# Patient Record
Sex: Female | Born: 1960 | Race: Black or African American | Hispanic: No | Marital: Married | Smoking: Former smoker
Health system: Southern US, Community
[De-identification: ages and names within clinical notes are randomized; demographics above are authoritative.]

## PROBLEM LIST (undated history)

## (undated) DIAGNOSIS — A64 Unspecified sexually transmitted disease: Secondary | ICD-10-CM

## (undated) DIAGNOSIS — R7309 Other abnormal glucose: Secondary | ICD-10-CM

## (undated) DIAGNOSIS — E7800 Pure hypercholesterolemia, unspecified: Secondary | ICD-10-CM

## (undated) DIAGNOSIS — M109 Gout, unspecified: Secondary | ICD-10-CM

## (undated) DIAGNOSIS — E669 Obesity, unspecified: Secondary | ICD-10-CM

## (undated) DIAGNOSIS — R87619 Unspecified abnormal cytological findings in specimens from cervix uteri: Secondary | ICD-10-CM

## (undated) DIAGNOSIS — N186 End stage renal disease: Secondary | ICD-10-CM

## (undated) DIAGNOSIS — I10 Essential (primary) hypertension: Secondary | ICD-10-CM

## (undated) HISTORY — DX: Unspecified abnormal cytological findings in specimens from cervix uteri: R87.619

## (undated) HISTORY — PX: COLPOSCOPY: SHX161

## (undated) HISTORY — DX: Obesity, unspecified: E66.9

## (undated) HISTORY — PX: COSMETIC SURGERY: SHX468

## (undated) HISTORY — DX: Unspecified sexually transmitted disease: A64

## (undated) HISTORY — DX: Other abnormal glucose: R73.09

## (undated) HISTORY — DX: End stage renal disease: N18.6

## (undated) HISTORY — DX: Gout, unspecified: M10.9

---

## 1989-08-09 HISTORY — PX: TUBAL LIGATION: SHX77

## 2001-03-10 ENCOUNTER — Emergency Department (HOSPITAL_COMMUNITY): Admission: EM | Admit: 2001-03-10 | Discharge: 2001-03-10 | Payer: Self-pay | Admitting: Emergency Medicine

## 2001-03-10 ENCOUNTER — Encounter: Payer: Self-pay | Admitting: Emergency Medicine

## 2003-05-23 ENCOUNTER — Other Ambulatory Visit: Admission: RE | Admit: 2003-05-23 | Discharge: 2003-05-23 | Payer: Self-pay | Admitting: Gynecology

## 2003-06-24 ENCOUNTER — Ambulatory Visit (HOSPITAL_COMMUNITY): Admission: RE | Admit: 2003-06-24 | Discharge: 2003-06-24 | Payer: Self-pay | Admitting: Internal Medicine

## 2004-12-04 ENCOUNTER — Other Ambulatory Visit: Admission: RE | Admit: 2004-12-04 | Discharge: 2004-12-04 | Payer: Self-pay | Admitting: Obstetrics and Gynecology

## 2005-07-08 ENCOUNTER — Encounter: Admission: RE | Admit: 2005-07-08 | Discharge: 2005-07-08 | Payer: Self-pay | Admitting: Nephrology

## 2005-08-09 HISTORY — PX: BREAST SURGERY: SHX581

## 2006-01-05 ENCOUNTER — Other Ambulatory Visit: Admission: RE | Admit: 2006-01-05 | Discharge: 2006-01-05 | Payer: Self-pay | Admitting: Obstetrics & Gynecology

## 2007-01-10 ENCOUNTER — Other Ambulatory Visit: Admission: RE | Admit: 2007-01-10 | Discharge: 2007-01-10 | Payer: Self-pay | Admitting: Obstetrics & Gynecology

## 2008-03-05 ENCOUNTER — Other Ambulatory Visit: Admission: RE | Admit: 2008-03-05 | Discharge: 2008-03-05 | Payer: Self-pay | Admitting: Obstetrics and Gynecology

## 2010-08-09 LAB — HM PAP SMEAR

## 2010-08-09 LAB — HM COLONOSCOPY

## 2011-08-24 ENCOUNTER — Ambulatory Visit (HOSPITAL_COMMUNITY)
Admission: RE | Admit: 2011-08-24 | Discharge: 2011-08-24 | Disposition: A | Discharge status: Home / Self Care | Payer: BC Managed Care – PPO | Source: Ambulatory Visit | Attending: Internal Medicine | Admitting: Internal Medicine

## 2011-08-24 ENCOUNTER — Other Ambulatory Visit (HOSPITAL_COMMUNITY): Payer: Self-pay | Admitting: Internal Medicine

## 2011-08-24 DIAGNOSIS — IMO0002 Reserved for concepts with insufficient information to code with codable children: Secondary | ICD-10-CM

## 2011-08-24 DIAGNOSIS — M545 Low back pain, unspecified: Secondary | ICD-10-CM | POA: Insufficient documentation

## 2011-08-24 DIAGNOSIS — M25569 Pain in unspecified knee: Secondary | ICD-10-CM

## 2012-06-20 LAB — HM MAMMOGRAPHY

## 2012-06-23 LAB — HM PAP SMEAR: HM Pap smear: NEGATIVE

## 2012-11-12 ENCOUNTER — Emergency Department (INDEPENDENT_AMBULATORY_CARE_PROVIDER_SITE_OTHER)
Admission: EM | Admit: 2012-11-12 | Discharge: 2012-11-12 | Disposition: A | Discharge status: Home / Self Care | Payer: 59 | Source: Home / Self Care | Attending: Emergency Medicine | Admitting: Emergency Medicine

## 2012-11-12 ENCOUNTER — Encounter (HOSPITAL_COMMUNITY): Payer: Self-pay | Admitting: Emergency Medicine

## 2012-11-12 DIAGNOSIS — M722 Plantar fascial fibromatosis: Secondary | ICD-10-CM

## 2012-11-12 HISTORY — DX: Essential (primary) hypertension: I10

## 2012-11-12 HISTORY — DX: Pure hypercholesterolemia, unspecified: E78.00

## 2012-11-12 MED ORDER — TRAMADOL HCL 50 MG PO TABS: 50.0000 mg | ORAL_TABLET | Freq: Four times a day (QID) | ORAL | Status: DC | PRN

## 2012-11-12 NOTE — ED Provider Notes (Signed)
History     CSN: [id number]  Arrival date & time 11/12/12  1329   First MD Initiated Contact with Patient 11/12/12 1359      Chief Complaint  Patient presents with  . Foot Pain    left lateral foot pain. denies injur. no swelling or redness    (Consider location/radiation/quality/duration/timing/severity/associated sxs/prior treatment) HPI Comments: 52 year old female presents with 2 days of foot pain. 2 days ago she awoke placed her left foot on the floor and experienced acute severe pain along the lateral age and a portion of the plantar heel. She denies any known trauma or injury to the foot. The pain is worse with walking and for splinting foot all floor after several minutes or hours of rest. She wears narrow shoes with elevated he heels.   Past Medical History  Diagnosis Date  . Hypertension   . High cholesterol     History reviewed. No pertinent past surgical history.  History reviewed. No pertinent family history.  History  Substance Use Topics  . Smoking status: Never Smoker   . Smokeless tobacco: Not on file  . Alcohol Use: No    OB History   Grav Para Term Preterm Abortions TAB SAB Ect Mult Living                  Review of Systems  Constitutional: Negative for fever, chills and activity change.  HENT: Negative.   Respiratory: Negative.   Musculoskeletal: Negative for myalgias, back pain and joint swelling.       As per HPI  Skin: Negative for color change, pallor and rash.  Neurological: Negative.   Psychiatric/Behavioral: Negative.     Allergies  Review of patient's allergies indicates no known allergies.  Home Medications   Current Outpatient Rx  Name  Route  Sig  Dispense  Refill  . AMLODIPINE BESYLATE PO   Oral   Take by mouth.         Marland Kitchen LOSARTAN POTASSIUM PO   Oral   Take by mouth.         . traMADol (ULTRAM) 50 MG tablet   Oral   Take 1 tablet (50 mg total) by mouth every 6 (six) hours as needed for pain.   20 tablet   0      BP 115/78  Pulse 77  Temp(Src) 98.2 F (36.8 C) (Oral)  Resp 20  SpO2 100%  Physical Exam  Nursing note and vitals reviewed. Constitutional: She is oriented to person, place, and time. She appears well-developed and well-nourished. No distress.  HENT:  Head: Normocephalic and atraumatic.  Eyes: EOM are normal.  Neck: Normal range of motion. Neck supple.  Pulmonary/Chest: Effort normal. No respiratory distress.  Musculoskeletal: Normal range of motion. She exhibits tenderness. She exhibits no edema.  Left foot and ankle architecture is normal with exception of old, chronic 90 and distal first metatarsal. This is painless and nonproblematic. Tenderness is greatest along the lateral and plantar aspect of the heel and proximal foot. No tenderness of the metatarsals or ankle. No erythema, deformity or swelling.  Neurological: She is alert and oriented to person, place, and time. No cranial nerve deficit.  Skin: Skin is warm and dry.  Psychiatric: She has a normal mood and affect.    ED Course  Procedures (including critical care time)  Labs Reviewed - No data to display No results found.   1. Plantar fasciitis of left foot       MDM  Wear  comfortable shoes as described in your written instructions Full shoe insert with good arch support Roll the foot over frozen can several times during the day Tramadol 50 mg every 4-6 hours when necessary pain Followup with [facility] or your primary care doctor as needed for persistent pain.        Janne Napoleon, NP 11/12/12 1623

## 2012-11-12 NOTE — ED Provider Notes (Signed)
Medical screening examination/treatment/procedure(s) were performed by non-physician practitioner and as supervising physician I was immediately available for consultation/collaboration.  Philipp Deputy, M.D.  Harden Mo, MD 11/12/12 [phone]

## 2012-11-12 NOTE — ED Notes (Signed)
Reports: left lateral foot pain. Denies injury. No swelling or redness.  Pt states pain with standing and walking. Pt has tried tylenol and aleve with no relief of pain   Symptoms x about 1 wk.   Family hx of gout.

## 2013-06-20 ENCOUNTER — Encounter: Payer: Self-pay | Admitting: Internal Medicine

## 2013-06-20 DIAGNOSIS — R7309 Other abnormal glucose: Secondary | ICD-10-CM | POA: Insufficient documentation

## 2013-06-20 DIAGNOSIS — I10 Essential (primary) hypertension: Secondary | ICD-10-CM | POA: Insufficient documentation

## 2013-06-20 DIAGNOSIS — E669 Obesity, unspecified: Secondary | ICD-10-CM

## 2013-06-20 DIAGNOSIS — E7800 Pure hypercholesterolemia, unspecified: Secondary | ICD-10-CM | POA: Insufficient documentation

## 2013-06-21 ENCOUNTER — Encounter: Payer: Self-pay | Admitting: Emergency Medicine

## 2013-06-21 ENCOUNTER — Ambulatory Visit: Payer: 59 | Admitting: Emergency Medicine

## 2013-06-21 ENCOUNTER — Other Ambulatory Visit: Payer: Self-pay | Admitting: Emergency Medicine

## 2013-06-21 VITALS — BP 118/70 | HR 80 | Temp 98.0°F | Resp 18 | Ht 63.5 in | Wt 203.0 lb

## 2013-06-21 DIAGNOSIS — E559 Vitamin D deficiency, unspecified: Secondary | ICD-10-CM

## 2013-06-21 DIAGNOSIS — M109 Gout, unspecified: Secondary | ICD-10-CM

## 2013-06-21 DIAGNOSIS — R7309 Other abnormal glucose: Secondary | ICD-10-CM

## 2013-06-21 DIAGNOSIS — E782 Mixed hyperlipidemia: Secondary | ICD-10-CM

## 2013-06-21 DIAGNOSIS — I10 Essential (primary) hypertension: Secondary | ICD-10-CM

## 2013-06-21 DIAGNOSIS — Z0000 Encounter for general adult medical examination without abnormal findings: Secondary | ICD-10-CM

## 2013-06-21 LAB — CBC WITH DIFFERENTIAL/PLATELET
Basophils Absolute: 0 10*3/uL (ref 0.0–0.1)
Basophils Relative: 0 % (ref 0–1)
Eosinophils Absolute: 0.2 10*3/uL (ref 0.0–0.7)
Eosinophils Relative: 2 % (ref 0–5)
HCT: 45 % (ref 36.0–46.0)
Hemoglobin: 15.3 g/dL — ABNORMAL HIGH (ref 12.0–15.0)
Lymphocytes Relative: 43 % (ref 12–46)
Lymphs Abs: 4.1 10*3/uL — ABNORMAL HIGH (ref 0.7–4.0)
MCH: 31.9 pg (ref 26.0–34.0)
MCHC: 34 g/dL (ref 30.0–36.0)
MCV: 93.8 fL (ref 78.0–100.0)
Monocytes Absolute: 0.5 10*3/uL (ref 0.1–1.0)
Monocytes Relative: 5 % (ref 3–12)
Neutro Abs: 4.8 10*3/uL (ref 1.7–7.7)
Neutrophils Relative %: 50 % (ref 43–77)
Platelets: 304 10*3/uL (ref 150–400)
RBC: 4.8 MIL/uL (ref 3.87–5.11)
RDW: 14.3 % (ref 11.5–15.5)
WBC: 9.5 10*3/uL (ref 4.0–10.5)

## 2013-06-21 LAB — BASIC METABOLIC PANEL WITH GFR
BUN: 23 mg/dL (ref 6–23)
CO2: 29 mEq/L (ref 19–32)
Calcium: 9.7 mg/dL (ref 8.4–10.5)
Chloride: 101 mEq/L (ref 96–112)
Creat: 1.71 mg/dL — ABNORMAL HIGH (ref 0.50–1.10)
GFR, Est African American: 39 mL/min — ABNORMAL LOW
GFR, Est Non African American: 34 mL/min — ABNORMAL LOW
Glucose, Bld: 89 mg/dL (ref 70–99)
Potassium: 3.6 mEq/L (ref 3.5–5.3)
Sodium: 139 mEq/L (ref 135–145)

## 2013-06-21 LAB — HEPATIC FUNCTION PANEL
ALT: 27 U/L (ref 0–35)
AST: 15 U/L (ref 0–37)
Albumin: 4.2 g/dL (ref 3.5–5.2)
Alkaline Phosphatase: 60 U/L (ref 39–117)
Bilirubin, Direct: 0.1 mg/dL (ref 0.0–0.3)
Indirect Bilirubin: 0.6 mg/dL (ref 0.0–0.9)
Total Bilirubin: 0.7 mg/dL (ref 0.3–1.2)
Total Protein: 7.4 g/dL (ref 6.0–8.3)

## 2013-06-21 LAB — HM MAMMOGRAPHY

## 2013-06-21 LAB — HM DEXA SCAN

## 2013-06-21 LAB — TSH: TSH: 1.221 u[IU]/mL (ref 0.350–4.500)

## 2013-06-21 LAB — MAGNESIUM: Magnesium: 2 mg/dL (ref 1.5–2.5)

## 2013-06-21 LAB — URIC ACID: Uric Acid, Serum: 8.6 mg/dL — ABNORMAL HIGH (ref 2.4–7.0)

## 2013-06-21 MED ORDER — LOSARTAN POTASSIUM 100 MG PO TABS: 100.0000 mg | ORAL_TABLET | Freq: Every day | ORAL | Status: DC

## 2013-06-21 NOTE — Patient Instructions (Signed)
Gout  Gout is when your joints become red, sore, and swell (inflammed). This is caused by the buildup of uric acid crystals in the joints. Uric acid is a chemical that is normally in the blood. If the level of uric acid gets too high in the blood, these crystals form in your joints and tissues. Over time, these crystals can form into masses near the joints and tissues. These masses can destroy bone and cause the bone to look misshapen (deformed).  HOME CARE   · Do not take aspirin for pain.  · Only take medicine as told by your doctor.  · Rest the joint as much as you can. When in bed, keep sheets and blankets off painful areas.  · Keep the sore joints raised (elevated).  · Put warm or cold packs on painful joints. Use of warm or cold packs depends on which works best for you.  · Use crutches if the painful joint is in your leg.  · Drink enough fluids to keep your pee (urine) clear or pale yellow. Limit alcohol, sugary drinks, and drinks with fructose in them.  · Follow your diet instructions. Pay careful attention to how much protein you eat. Include fruits, vegetables, whole grains, and fat-free or low-fat milk products in your daily diet. Talk to your doctor or dietician about the use of coffee, vitamin C, and cherries. These may help lower uric acid levels.  · Keep a healthy body weight.  GET HELP RIGHT AWAY IF:   · You have watery poop (diarrhea), throw up (vomit), or have any side effects from medicines.  · You do not feel better in 24 hours, or you are getting worse.  · Your joint becomes suddenly more tender, and you have chills or a fever.  MAKE SURE YOU:   · Understand these instructions.  · Will watch your condition.  · Will get help right away if you are not doing well or get worse.  Document Released: 05/04/2008 Document Revised: 11/20/2012 Document Reviewed: 11/03/2009  ExitCare® Patient Information ©2014 [employer].

## 2013-06-22 ENCOUNTER — Telehealth: Payer: Self-pay | Admitting: *Deleted

## 2013-06-22 DIAGNOSIS — M109 Gout, unspecified: Secondary | ICD-10-CM

## 2013-06-22 LAB — URINALYSIS, MICROSCOPIC ONLY
Casts: NONE SEEN
Crystals: NONE SEEN

## 2013-06-22 LAB — MICROALBUMIN / CREATININE URINE RATIO
Creatinine, Urine: 194.6 mg/dL
Microalb Creat Ratio: 226.3 mg/g — ABNORMAL HIGH (ref 0.0–30.0)
Microalb, Ur: 44.03 mg/dL — ABNORMAL HIGH (ref 0.00–1.89)

## 2013-06-22 LAB — URINALYSIS, ROUTINE W REFLEX MICROSCOPIC
Bilirubin Urine: NEGATIVE
Glucose, UA: NEGATIVE mg/dL
Hgb urine dipstick: NEGATIVE
Ketones, ur: NEGATIVE mg/dL
Leukocytes, UA: NEGATIVE
Nitrite: NEGATIVE
Protein, ur: 100 mg/dL — AB
Specific Gravity, Urine: 1.018 (ref 1.005–1.030)
Urobilinogen, UA: 0.2 mg/dL (ref 0.0–1.0)
pH: 6 (ref 5.0–8.0)

## 2013-06-22 MED ORDER — ALLOPURINOL 300 MG PO TABS: 300.0000 mg | ORAL_TABLET | Freq: Every day | ORAL | Status: DC

## 2013-06-22 MED ORDER — PREDNISONE (PAK) 10 MG PO TABS
ORAL_TABLET | Freq: Every day | ORAL | Status: DC
Start: 2013-06-22 — End: 2013-08-10

## 2013-06-22 NOTE — Telephone Encounter (Signed)
Advise her gout level still elevated will call in Allopurinol and repeat Pred DP.  Labs are still not complete will call Monday with pending results.

## 2013-06-22 NOTE — Telephone Encounter (Signed)
PTS GREAT TOE IS PAINFUL DOES NOT WANT TO END UP IN THE (ER) THIS WEEKEND  *PLEASE ADVISE  PT SAID SHE DISCUSSED THIS YESTERDAY?

## 2013-06-22 NOTE — Telephone Encounter (Signed)
Pt aware.

## 2013-06-24 ENCOUNTER — Encounter: Payer: Self-pay | Admitting: Emergency Medicine

## 2013-06-24 MED ORDER — CIPROFLOXACIN HCL 500 MG PO TABS: 500.0000 mg | ORAL_TABLET | Freq: Two times a day (BID) | ORAL | Status: AC

## 2013-06-24 NOTE — Progress Notes (Signed)
Subjective:    Patient ID: Ashley Boyle, female    DOB: 06-24-1961, 52 y.o.   MRN: [id number]  HPI Comments: 52 yo AAF presents for CPE. She has been doing well overall, only new concern is recently diagnosed with Gout by UC. She has also seen Sophronia Simas for f/u with continued RT great toe pain and swelling. She completed a trial of Prednisone with some relief, Uloric made symptoms worse, and did short trial of Colcrys with some relief. Mother has Hx of gout. She has been eating descent and exercising had increased until gout started x 2 weeks now with symptoms.    Current Outpatient Prescriptions on File Prior to Visit  Medication Sig Dispense Refill  . AMLODIPINE BESYLATE PO Take 5 mg by mouth daily.       . Pitavastatin Calcium (LIVALO) 4 MG TABS Take 4 mg by mouth daily. 1 qod      . traMADol (ULTRAM) 50 MG tablet Take 1 tablet (50 mg total) by mouth every 6 (six) hours as needed for pain.  20 tablet  0   No current facility-administered medications on file prior to visit.   ALLERGIES Crestor; Lipitor; and Vytorin INTOL-Uloric- Increased edema  Past Medical History  Diagnosis Date  . Hypertension   . High cholesterol   . Obesity, unspecified   . Other abnormal glucose     Past Surgical History  Procedure Laterality Date  . Tubal ligation    . Colposcopy    . Cosmetic surgery      breast reduction    History  Substance Use Topics  . Smoking status: Former Smoker    Quit date: 06/21/1988  . Smokeless tobacco: Not on file  . Alcohol Use: No    Family History  Problem Relation Age of Onset  . Hypertension Mother   . Cancer Mother     cervical  . Hyperlipidemia Mother   . Hypertension Father   . Hyperlipidemia Father   . Diabetes Other   . Cancer Other 40    breast      Review of Systems  Eyes:       Dr. Nicki Reaper  Gastrointestinal:       Dr. Collene Mares  Genitourinary:       Dr. Hassell Done- Nephrology. Dr. Mayer Masker  Musculoskeletal: Positive for arthralgias.       Murphy/  Noemi Chapel  All other systems reviewed and are negative.   BP 118/70  Pulse 80  Temp(Src) 98 F (36.7 C) (Oral)  Resp 18  Ht 5' 3.5" (1.613 m)  Wt 203 lb (92.08 kg)  BMI 35.39 kg/m2     Objective:   Physical Exam  Nursing note and vitals reviewed. Constitutional: She is oriented to person, place, and time. She appears well-developed and well-nourished.  HENT:  Head: Normocephalic and atraumatic.  Right Ear: External ear normal.  Left Ear: External ear normal.  Nose: Nose normal.  Mouth/Throat: Oropharynx is clear and moist.  Eyes: Conjunctivae and EOM are normal. Pupils are equal, round, and reactive to light.  Neck: Normal range of motion. Neck supple. No JVD present. No tracheal deviation present. No thyromegaly present.  Cardiovascular: Normal rate, regular rhythm, normal heart sounds and intact distal pulses.   Pulmonary/Chest: Effort normal and breath sounds normal. She exhibits no tenderness.  Abdominal: Soft. Bowel sounds are normal. She exhibits no distension and no mass. There is no tenderness. There is no guarding.  Genitourinary:  Defer to GYN/ Nephr  Musculoskeletal: Normal range of motion.  She exhibits edema and tenderness.  Right great toe  Lymphadenopathy:    She has no cervical adenopathy.  Neurological: She is alert and oriented to person, place, and time. She has normal reflexes. No cranial nerve deficit. Coordination normal.  Skin: Skin is warm and dry.  Psychiatric: She has a normal mood and affect. Her behavior is normal. Judgment and thought content normal.     EKG NSCSPT WNL     Assessment & Plan:  1. CPE check labs, will get mammo and BMD today. Advised needs wt loss, better diet and cardio QD. 2. New DX of gout with RT great toe pain with difficulty with Uloric, check labs Prednisone 10 mg DP AD, Allopurinol 300 mg AD. D/C HCTZ new Rx Losartan 100 mg AD given

## 2013-06-25 ENCOUNTER — Other Ambulatory Visit: Payer: Self-pay | Admitting: Emergency Medicine

## 2013-06-25 DIAGNOSIS — N390 Urinary tract infection, site not specified: Secondary | ICD-10-CM

## 2013-06-25 LAB — URINE CULTURE: Culture: >=100000

## 2013-06-25 LAB — TB SKIN TEST
Induration: 0 mm
TB Skin Test: NEGATIVE

## 2013-06-29 ENCOUNTER — Ambulatory Visit: Payer: Self-pay | Admitting: Nurse Practitioner

## 2013-07-09 ENCOUNTER — Ambulatory Visit (INDEPENDENT_AMBULATORY_CARE_PROVIDER_SITE_OTHER): Payer: 59 | Admitting: Emergency Medicine

## 2013-07-09 VITALS — BP 122/72 | HR 76 | Resp 18

## 2013-07-09 DIAGNOSIS — I10 Essential (primary) hypertension: Secondary | ICD-10-CM

## 2013-07-09 DIAGNOSIS — M109 Gout, unspecified: Secondary | ICD-10-CM

## 2013-07-09 LAB — CBC WITH DIFFERENTIAL/PLATELET
Basophils Absolute: 0 10*3/uL (ref 0.0–0.1)
Basophils Relative: 1 % (ref 0–1)
Eosinophils Absolute: 0.1 10*3/uL (ref 0.0–0.7)
Eosinophils Relative: 2 % (ref 0–5)
HCT: 41.3 % (ref 36.0–46.0)
Hemoglobin: 14 g/dL (ref 12.0–15.0)
Lymphocytes Relative: 47 % — ABNORMAL HIGH (ref 12–46)
Lymphs Abs: 2.9 10*3/uL (ref 0.7–4.0)
MCH: 32.3 pg (ref 26.0–34.0)
MCHC: 33.9 g/dL (ref 30.0–36.0)
MCV: 95.2 fL (ref 78.0–100.0)
Monocytes Absolute: 0.5 10*3/uL (ref 0.1–1.0)
Monocytes Relative: 9 % (ref 3–12)
Neutro Abs: 2.6 10*3/uL (ref 1.7–7.7)
Neutrophils Relative %: 41 % — ABNORMAL LOW (ref 43–77)
Platelets: 312 10*3/uL (ref 150–400)
RBC: 4.34 MIL/uL (ref 3.87–5.11)
RDW: 14.3 % (ref 11.5–15.5)
WBC: 6.2 10*3/uL (ref 4.0–10.5)

## 2013-07-09 MED ORDER — FUROSEMIDE 20 MG PO TABS: 20.0000 mg | ORAL_TABLET | Freq: Every day | ORAL | Status: DC

## 2013-07-09 NOTE — Patient Instructions (Signed)
Edema °Edema is a buildup of fluids. It is most common in the feet, ankles, and legs. This happens more as a person ages. It may affect one or both legs. °HOME CARE  °· Raise (elevate) the legs or ankles above the level of the heart while lying down. °· Avoid sitting or standing still for a long time. °· Exercise the legs to help the puffiness (swelling) go down. °· A low-salt diet may help lessen the puffiness. °· Only take medicine as told by your doctor. °GET HELP RIGHT AWAY IF:  °· You develop shortness of breath or chest pain. °· You cannot breathe when you lie down. °· You have more puffiness that does not go away with treatment. °· You develop pain or redness in the areas that are puffy. °· You have a temperature by mouth above 102° F (38.9° C), not controlled by medicine. °· You gain 03 lb/1.4 kg or more in 1 day or 05 lb/2.3 kg in a week. °MAKE SURE YOU:  °· Understand these instructions. °· Will watch your condition. °· Will get help right away if you are not doing well or get worse. °Document Released: 01/12/2008 Document Revised: 10/18/2011 Document Reviewed: 01/12/2008 °ExitCare® Patient Information ©2014 [employer]. ° °

## 2013-07-10 ENCOUNTER — Encounter: Payer: Self-pay | Admitting: Emergency Medicine

## 2013-07-10 LAB — URIC ACID: Uric Acid, Serum: 6.3 mg/dL (ref 2.4–7.0)

## 2013-07-10 LAB — BASIC METABOLIC PANEL WITH GFR
BUN: 19 mg/dL (ref 6–23)
CO2: 26 mEq/L (ref 19–32)
Calcium: 10 mg/dL (ref 8.4–10.5)
Chloride: 105 mEq/L (ref 96–112)
Creat: 1.72 mg/dL — ABNORMAL HIGH (ref 0.50–1.10)
GFR, Est African American: 39 mL/min — ABNORMAL LOW
GFR, Est Non African American: 33 mL/min — ABNORMAL LOW
Glucose, Bld: 95 mg/dL (ref 70–99)
Potassium: 4.2 mEq/L (ref 3.5–5.3)
Sodium: 140 mEq/L (ref 135–145)

## 2013-07-10 NOTE — Progress Notes (Signed)
   Subjective:    Patient ID: Ashley Boyle, female    DOB: 04/30/1961, 52 y.o.   MRN: [id number]  HPI Comments: 52 yo recent diagnosis of gout was started on Allopurinol, Prednisone and supposed to d/c Losartan/Hctz combo. Patient restarted hctz last week due to some edema in hands. She has had improvement with her right foot pain and edema. She has not been eating well over the holidays and has not been checking hr BP.    Current Outpatient Prescriptions on File Prior to Visit  Medication Sig Dispense Refill  . allopurinol (ZYLOPRIM) 300 MG tablet Take 1 tablet (300 mg total) by mouth daily.  30 tablet  6  . AMLODIPINE BESYLATE PO Take 5 mg by mouth daily.       Marland Kitchen estradiol (ESTRACE) 0.5 MG tablet Take 0.5 mg by mouth daily.      Marland Kitchen losartan (COZAAR) 100 MG tablet Take 1 tablet (100 mg total) by mouth daily.  90 tablet  2  . progesterone (PROMETRIUM) 100 MG capsule Take 100 mg by mouth daily.      . Pitavastatin Calcium (LIVALO) 4 MG TABS Take 4 mg by mouth daily. 1 qod      . predniSONE (STERAPRED UNI-PAK) 10 MG tablet Take by mouth daily.  21 tablet  0  . traMADol (ULTRAM) 50 MG tablet Take 1 tablet (50 mg total) by mouth every 6 (six) hours as needed for pain.  20 tablet  0   No current facility-administered medications on file prior to visit.   ALLERGIES Crestor; Lipitor; and Vytorin  Past Medical History  Diagnosis Date  . Hypertension   . High cholesterol   . Obesity, unspecified   . Other abnormal glucose    NEW DIAGNOSIS GOUT  Review of Systems  All other systems reviewed and are negative.    BP 122/72  Pulse 76  Resp 18 Refused weight    Objective:   Physical Exam  Nursing note and vitals reviewed. Constitutional: She is oriented to person, place, and time. She appears well-developed and well-nourished.  HENT:  Head: Normocephalic.  Neck: Normal range of motion.  Cardiovascular: Normal rate, regular rhythm, normal heart sounds and intact distal pulses.    Pulmonary/Chest: Effort normal and breath sounds normal.  Abdominal: Soft. There is no tenderness.  Musculoskeletal: Normal range of motion. She exhibits tenderness. She exhibits no edema.  Minimal right great toe base.  Neurological: She is alert and oriented to person, place, and time.  Skin: Skin is warm and dry. No erythema.          Assessment & Plan:  New diagnosis Gout with new RX Allopurinol and recent RX Prednisone. Advised AGAIN d/c HCTZ will give RX lasix 20 mg AD/ prn edema, switch to Losartan only, do not take COMBO pill with HCTZ. Recheck labs

## 2013-07-14 ENCOUNTER — Other Ambulatory Visit: Payer: Self-pay | Admitting: Emergency Medicine

## 2013-07-23 ENCOUNTER — Encounter: Payer: Self-pay | Admitting: Nurse Practitioner

## 2013-07-23 ENCOUNTER — Ambulatory Visit: Payer: Self-pay | Admitting: Nurse Practitioner

## 2013-08-07 ENCOUNTER — Encounter: Payer: Self-pay | Admitting: Nurse Practitioner

## 2013-08-08 ENCOUNTER — Other Ambulatory Visit: Payer: Self-pay | Admitting: Nurse Practitioner

## 2013-08-08 ENCOUNTER — Ambulatory Visit: Payer: Self-pay | Admitting: Nurse Practitioner

## 2013-08-10 ENCOUNTER — Ambulatory Visit: Payer: 59 | Admitting: Nurse Practitioner

## 2013-08-10 ENCOUNTER — Encounter: Payer: Self-pay | Admitting: Nurse Practitioner

## 2013-08-10 ENCOUNTER — Ambulatory Visit (INDEPENDENT_AMBULATORY_CARE_PROVIDER_SITE_OTHER): Payer: 59 | Admitting: Nurse Practitioner

## 2013-08-10 VITALS — BP 142/82 | HR 68 | Resp 16 | Ht 63.0 in | Wt 211.0 lb

## 2013-08-10 DIAGNOSIS — M109 Gout, unspecified: Secondary | ICD-10-CM

## 2013-08-10 DIAGNOSIS — I10 Essential (primary) hypertension: Secondary | ICD-10-CM

## 2013-08-10 DIAGNOSIS — Z01419 Encounter for gynecological examination (general) (routine) without abnormal findings: Secondary | ICD-10-CM

## 2013-08-10 DIAGNOSIS — Z0000 Encounter for general adult medical examination without abnormal findings: Secondary | ICD-10-CM

## 2013-08-10 DIAGNOSIS — R7309 Other abnormal glucose: Secondary | ICD-10-CM

## 2013-08-10 DIAGNOSIS — E559 Vitamin D deficiency, unspecified: Secondary | ICD-10-CM

## 2013-08-10 DIAGNOSIS — E782 Mixed hyperlipidemia: Secondary | ICD-10-CM

## 2013-08-10 LAB — POCT URINALYSIS DIPSTICK
Bilirubin, UA: NEGATIVE
Blood, UA: NEGATIVE
Glucose, UA: NEGATIVE
Ketones, UA: NEGATIVE
Leukocytes, UA: NEGATIVE
Nitrite, UA: NEGATIVE
Urobilinogen, UA: NEGATIVE
pH, UA: 5

## 2013-08-10 MED ORDER — ESTRADIOL 0.5 MG PO TABS: 0.5000 mg | ORAL_TABLET | Freq: Every day | ORAL | Status: DC

## 2013-08-10 MED ORDER — PROGESTERONE MICRONIZED 100 MG PO CAPS: 100.0000 mg | ORAL_CAPSULE | Freq: Every day | ORAL | Status: DC

## 2013-08-10 NOTE — Progress Notes (Signed)
53 y.o. [id number] Married African American Fe here for annual exam. She feels well on HRT and wants to continue.    Patient's last menstrual period was 08/09/2006.          Sexually active: yes  The current method of family planning is tubal ligation.    Exercising: no  not regularly Smoker:  no  Health Maintenance: Pap:  06/23/12 WNL History of abnormal Pap: 2010 - normal since MMG:  06/21/13 normal Colonoscopy:  2012 repeat in 10 years BMD:   06/21/13-patient is unsure of results TDaP:  05/10/11 Screening Labs: PCP, Urine today: PROTEIN-2+   reports that she quit smoking about 25 years ago. She has never used smokeless tobacco. She reports that she does not drink alcohol or use illicit drugs.  Past Medical History  Diagnosis Date  . Hypertension   . High cholesterol   . Obesity, unspecified   . Other abnormal glucose   . 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 Gout     Past Surgical History  Procedure Laterality Date  . Cosmetic surgery      breast reduction  . Breast surgery Bilateral 2007    reduction  . Colposcopy  108/10    negative, repeat pap normal  . Tubal ligation  1991    Current Outpatient Prescriptions  Medication Sig Dispense Refill  . allopurinol (ZYLOPRIM) 300 MG tablet Take 1 tablet (300 mg total) by mouth daily.  30 tablet  6  . AMLODIPINE BESYLATE PO Take 5 mg by mouth daily.       Marland Kitchen estradiol (ESTRACE) 0.5 MG tablet Take 1 tablet (0.5 mg total) by mouth daily.  90 tablet  3  . furosemide (LASIX) 20 MG tablet Take 1 tablet (20 mg total) by mouth daily.  30 tablet  11  . LIVALO 4 MG TABS TAKE 1 TABLET BY MOUTH DAILY  30 tablet  2  . losartan (COZAAR) 100 MG tablet Take 100 mg by mouth daily.      . progesterone (PROMETRIUM) 100 MG capsule Take 1 capsule (100 mg total) by mouth daily.  90 capsule  3   No current facility-administered medications for this visit.    Family History  Problem Relation Age of Onset  . Hypertension  Mother   . Cancer Mother     cervical  . Hyperlipidemia Mother   . Hypertension Father   . Hyperlipidemia Father   . Diabetes Other   . Cancer Other 40    breast  . Hyperlipidemia Brother   . Hypertension Brother     ROS:  Pertinent items are noted in HPI.  Otherwise, a comprehensive ROS was negative.  Exam:   BP 142/82  Pulse 68  Resp 16  Ht 5\' 3"  (1.6 m)  Wt 211 lb (95.709 kg)  BMI 37.39 kg/m2  LMP 08/09/2006  Weight change: @WEIGHT  CHANGE@ Height:   Height: 5\' 3"  (160 cm)  Ht Readings from Last 3 Encounters:  08/10/13 5\' 3"  (1.6 m)  06/21/13 5' 3.5" (1.613 m)    General appearance: alert, cooperative and appears stated age Head: Normocephalic, without obvious abnormality, atraumatic Neck: no adenopathy, supple, symmetrical, trachea midline and thyroid normal to inspection and palpation Lungs: clear to auscultation bilaterally Breasts: normal appearance, no masses or tenderness Heart: regular rate and rhythm Abdomen: soft, non-tender; bowel sounds normal; no masses,  no organomegaly Extremities: extremities normal, atraumatic, no cyanosis or edema Skin: Skin color,  texture, turgor normal. No rashes or lesions Lymph nodes: Cervical, supraclavicular, and axillary nodes normal. No abnormal inguinal nodes palpated Neurologic: Grossly normal   Pelvic: External genitalia:  no lesions              Urethra:  normal appearing urethra with no masses, tenderness or lesions              Bartholin's and Skene's: normal                 Vagina: normal appearing vagina with normal color and discharge, no lesions              Cervix: anteverted              Pap taken: yes Bimanual Exam:  Uterus:  normal size, contour, position, consistency, mobility, non-tender              Adnexa: no mass, fullness, tenderness               Rectovaginal: Confirms               Anus:  normal sphincter tone, no lesions  A:  Well Woman with normal exam  History of early menopause and HRT 09/2007  to present  History of abnormal pap 2010 with HPV, colpo biopsy normal  History of HTN  P:   Mammogram due 11/15  Pap smear done today  Refill on HRT for a year  Counseled about potential risk of CVA,DVT,cancer, etc.  Counseled on breast self exam, mammography screening, use and side effects of HRT, adequate intake of calcium and vitamin D, diet and exercise return annually or prn  An After Visit Summary was printed and given to the patient.

## 2013-08-10 NOTE — Patient Instructions (Signed)

## 2013-08-13 NOTE — Progress Notes (Signed)
Encounter reviewed by Dr. Brook Silva.  

## 2013-08-15 LAB — IPS PAP TEST WITH HPV

## 2013-08-15 NOTE — Progress Notes (Signed)
Scheduled

## 2013-08-15 NOTE — Progress Notes (Signed)
08/23/13 scheduled

## 2013-08-20 ENCOUNTER — Ambulatory Visit: Payer: Self-pay | Admitting: Emergency Medicine

## 2013-08-22 ENCOUNTER — Telehealth: Payer: Self-pay | Admitting: Nurse Practitioner

## 2013-08-22 NOTE — Telephone Encounter (Signed)
Patient wants to "change prescription to a combo pill." Patient went to pick up her prescription and the price has gone up.

## 2013-08-22 NOTE — Telephone Encounter (Signed)
Ashley Boyle is there a combination pill that you think may work for patient? She is already of Estrace and Prometrium. Advised patient to obtain rx formulary and see what her insurance covers. Advised insurance may not cover a combination pill. Advised I would send a message to [facility] but that patient should look for pharmacy formulary and call us back as well.

## 2013-08-23 ENCOUNTER — Ambulatory Visit (INDEPENDENT_AMBULATORY_CARE_PROVIDER_SITE_OTHER): Payer: 59 | Admitting: Emergency Medicine

## 2013-08-23 ENCOUNTER — Encounter: Payer: Self-pay | Admitting: Emergency Medicine

## 2013-08-23 VITALS — BP 122/78 | HR 64 | Temp 98.2°F | Resp 18 | Ht 63.5 in | Wt 206.0 lb

## 2013-08-23 DIAGNOSIS — E559 Vitamin D deficiency, unspecified: Secondary | ICD-10-CM

## 2013-08-23 DIAGNOSIS — R7309 Other abnormal glucose: Secondary | ICD-10-CM

## 2013-08-23 DIAGNOSIS — M109 Gout, unspecified: Secondary | ICD-10-CM

## 2013-08-23 DIAGNOSIS — I10 Essential (primary) hypertension: Secondary | ICD-10-CM

## 2013-08-23 DIAGNOSIS — E782 Mixed hyperlipidemia: Secondary | ICD-10-CM

## 2013-08-23 LAB — CBC WITH DIFFERENTIAL/PLATELET
Basophils Absolute: 0 10*3/uL (ref 0.0–0.1)
Basophils Relative: 1 % (ref 0–1)
Eosinophils Absolute: 0.1 10*3/uL (ref 0.0–0.7)
Eosinophils Relative: 2 % (ref 0–5)
HCT: 40.9 % (ref 36.0–46.0)
Hemoglobin: 13.6 g/dL (ref 12.0–15.0)
Lymphocytes Relative: 51 % — ABNORMAL HIGH (ref 12–46)
Lymphs Abs: 3.1 10*3/uL (ref 0.7–4.0)
MCH: 31.9 pg (ref 26.0–34.0)
MCHC: 33.3 g/dL (ref 30.0–36.0)
MCV: 95.8 fL (ref 78.0–100.0)
Monocytes Absolute: 0.4 10*3/uL (ref 0.1–1.0)
Monocytes Relative: 6 % (ref 3–12)
Neutro Abs: 2.4 10*3/uL (ref 1.7–7.7)
Neutrophils Relative %: 40 % — ABNORMAL LOW (ref 43–77)
Platelets: 281 10*3/uL (ref 150–400)
RBC: 4.27 MIL/uL (ref 3.87–5.11)
RDW: 13.6 % (ref 11.5–15.5)
WBC: 6 10*3/uL (ref 4.0–10.5)

## 2013-08-23 LAB — HEMOGLOBIN A1C
Hgb A1c MFr Bld: 5 % (ref <5.7)
Mean Plasma Glucose: 97 mg/dL (ref <117)

## 2013-08-23 IMAGING — CR DG LUMBAR SPINE COMPLETE 4+V
5 series · 5 of 5 positions shown · non-contrast
Comparison: None.

CLINICAL DATA: Low back pain radiating down the right leg for 1
month

LUMBAR SPINE - COMPLETE 4+ VIEW

[view not recorded (1 of 5)]
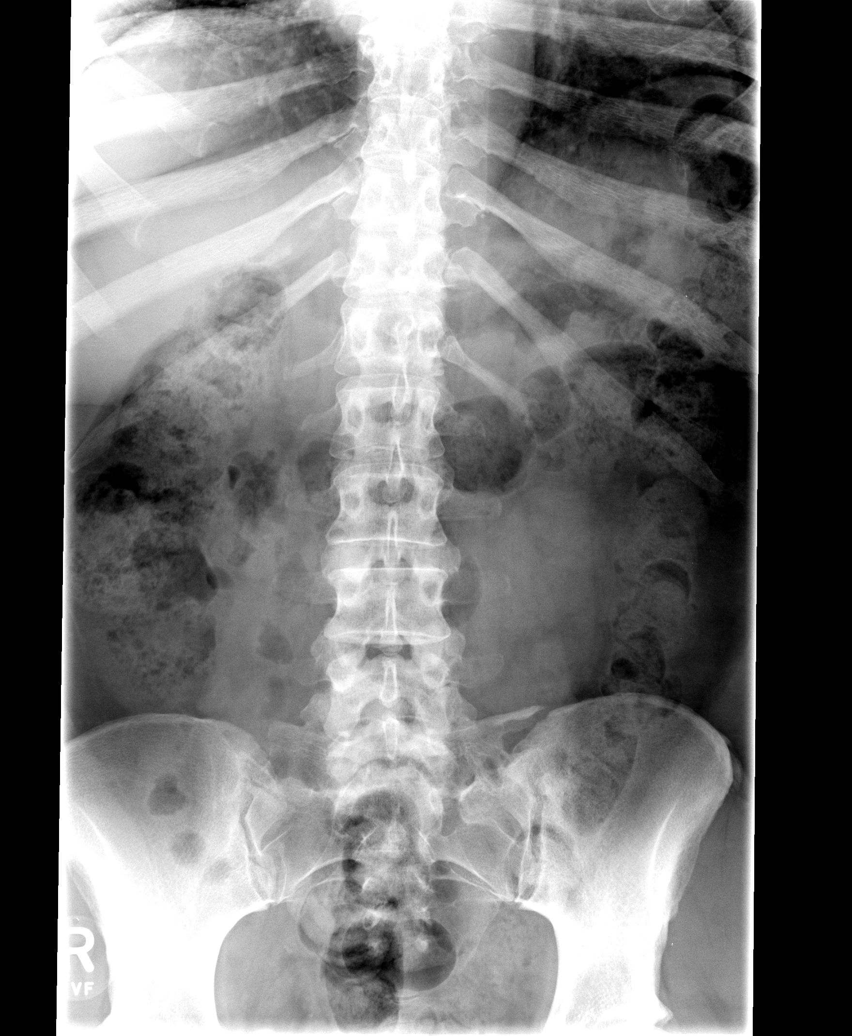

[view not recorded (2 of 5)]
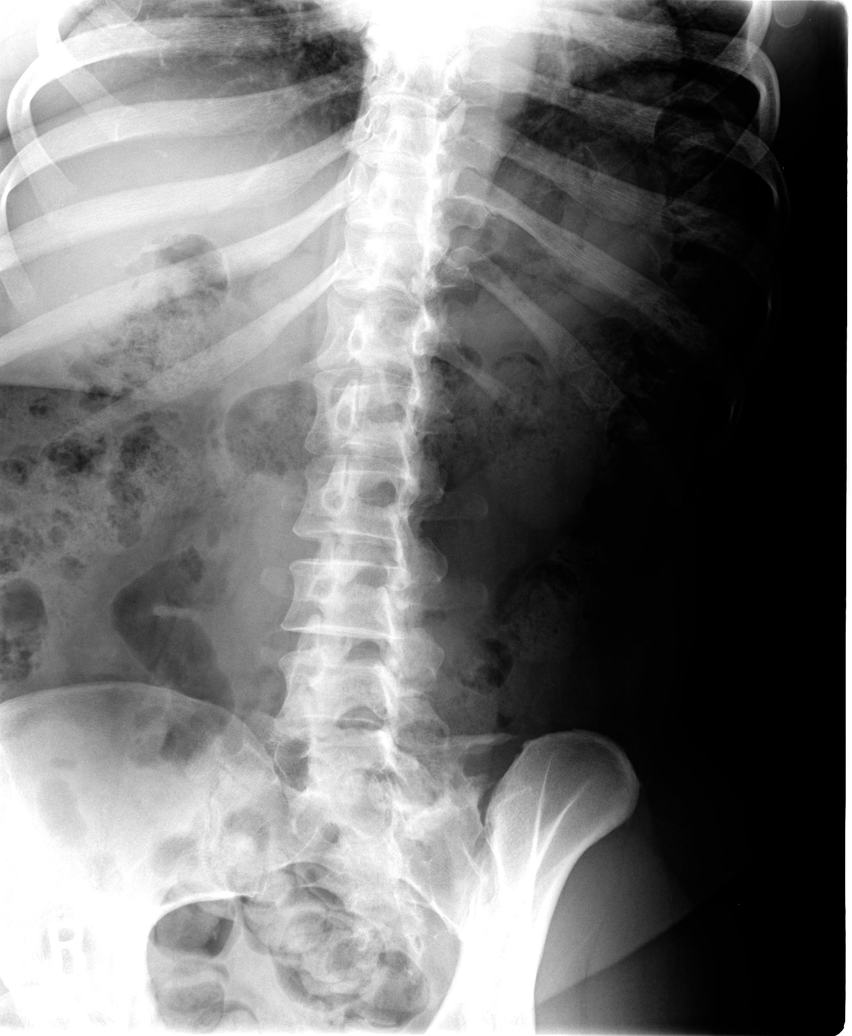

[view not recorded (3 of 5)]
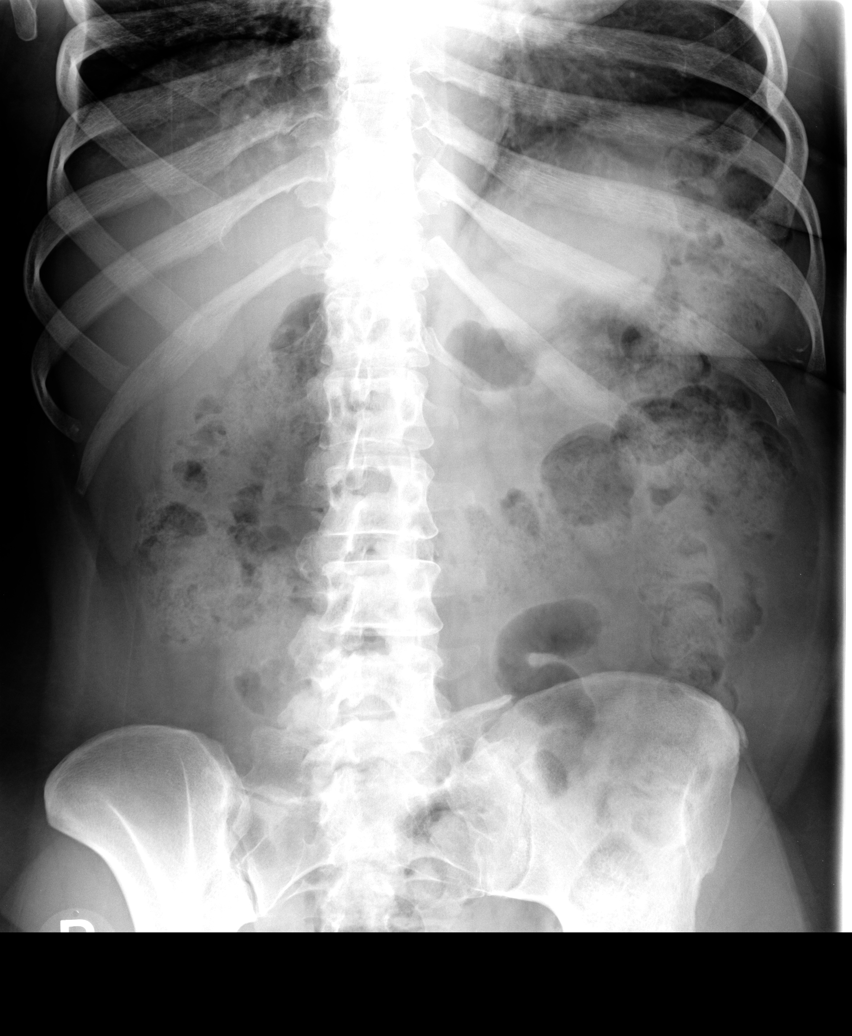

[view not recorded (4 of 5)]
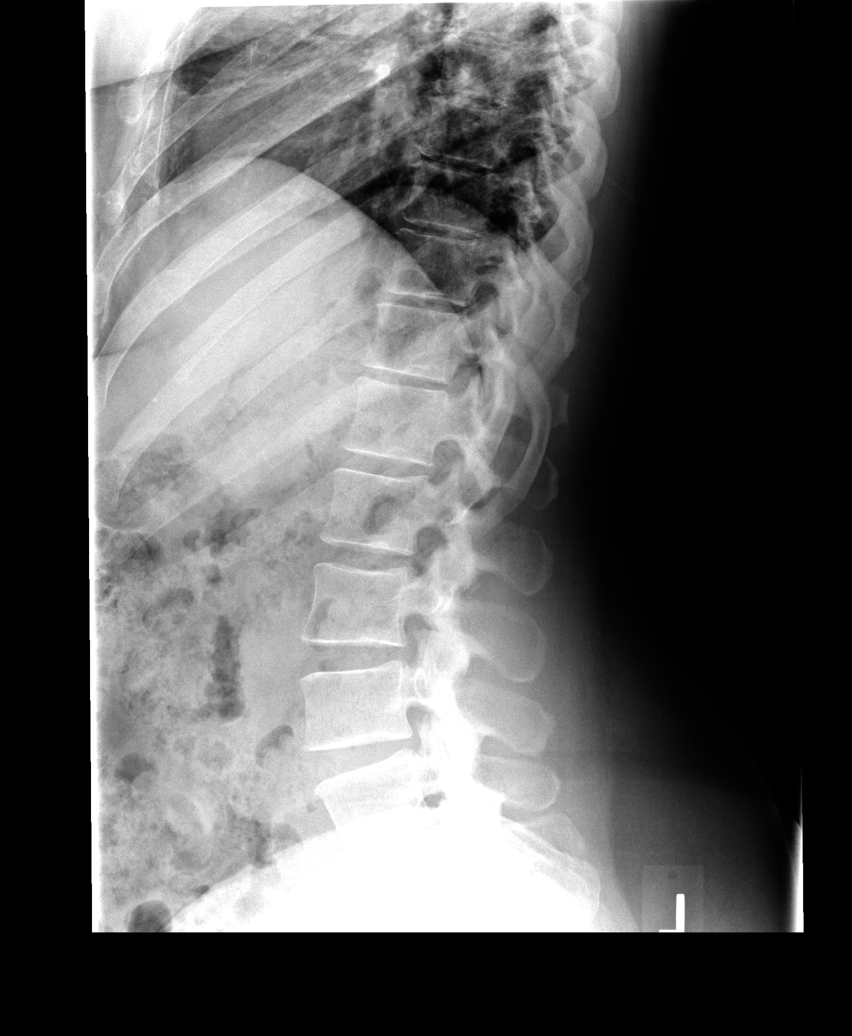

[view not recorded (5 of 5)]
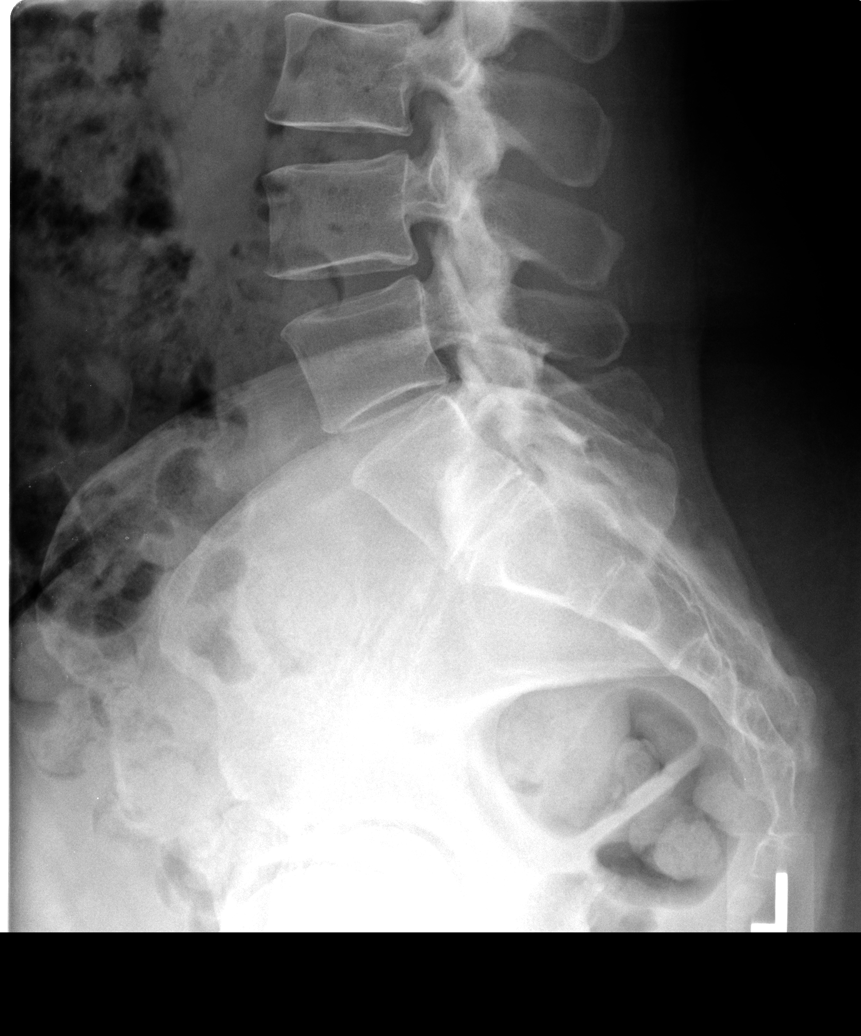

[5 of 5 positions shown; findings below may reference images not displayed]

FINDINGS: Five non-rib bearing lumbar type vertebral bodies are
identified.  No compression deformity.  Normal alignment.
Intervertebral disc spaces and vertebral body heights are
maintained.  On the coned-down lumbosacral image, there is 2 mm
retrolisthesis of L5 on S1. The leftward facing oblique view is
suboptimally positioned.
IMPRESSION: 2 mm retrolisthesis of L5 on S1.  Otherwise normal exam.

## 2013-08-23 NOTE — Patient Instructions (Signed)
PREDiabetes Meal Planning Guide The diabetes meal planning guide is a tool to help you plan your meals and snacks. It is important for people with diabetes to manage their blood glucose (sugar) levels. Choosing the right foods and the right amounts throughout your day will help control your blood glucose. Eating right can even help you improve your blood pressure and reach or maintain a healthy weight. CARBOHYDRATE COUNTING MADE EASY When you eat carbohydrates, they turn to sugar. This raises your blood glucose level. Counting carbohydrates can help you control this level so you feel better. When you plan your meals by counting carbohydrates, you can have more flexibility in what you eat and balance your medicine with your food intake. Carbohydrate counting simply means adding up the total amount of carbohydrate grams in your meals and snacks. Try to eat about the same amount at each meal. Foods with carbohydrates are listed below. Each portion below is 1 carbohydrate serving or 15 grams of carbohydrates. Ask your dietician how many grams of carbohydrates you should eat at each meal or snack. Grains and Starches  1 slice bread.   English muffin or hotdog/hamburger bun.   cup cold cereal (unsweetened).   cup cooked pasta or rice.   cup starchy vegetables (corn, potatoes, peas, beans, winter squash).  1 tortilla (6 inches).   bagel.  1 waffle or pancake (size of a CD).   cup cooked cereal.  4 to 6 small crackers. *Whole grain is recommended. Fruit  1 cup fresh unsweetened berries, melon, papaya, pineapple.  1 small fresh fruit.   banana or mango.   cup fruit juice (4 oz unsweetened).   cup canned fruit in natural juice or water.  2 tbs dried fruit.  12 to 15 grapes or cherries. Milk and Yogurt  1 cup fat-free or 1% milk.  1 cup soy milk.  6 oz light yogurt with sugar-free sweetener.  6 oz low-fat soy yogurt.  6 oz plain yogurt. Vegetables  1 cup raw or   cup cooked is counted as 0 carbohydrates or a "free" food.  If you eat 3 or more servings at 1 meal, count them as 1 carbohydrate serving. Other Carbohydrates   oz chips or pretzels.   cup ice cream or frozen yogurt.   cup sherbet or sorbet.  2 inch square cake, no frosting.  1 tbs honey, sugar, jam, jelly, or syrup.  2 small cookies.  3 squares of graham crackers.  3 cups popcorn.  6 crackers.  1 cup broth-based soup.  Count 1 cup casserole or other mixed foods as 2 carbohydrate servings.  Foods with less than 20 calories in a serving may be counted as 0 carbohydrates or a "free" food. You may want to purchase a book or computer software that lists the carbohydrate gram counts of different foods. In addition, the nutrition facts panel on the labels of the foods you eat are a good source of this information. The label will tell you how big the serving size is and the total number of carbohydrate grams you will be eating per serving. Divide this number by 15 to obtain the number of carbohydrate servings in a portion. Remember, 1 carbohydrate serving equals 15 grams of carbohydrate. SERVING SIZES Measuring foods and serving sizes helps you make sure you are getting the right amount of food. The list below tells how big or small some common serving sizes are.  1 oz.........4 stacked dice.  3 oz........Marland KitchenDeck of cards.  1 tsp.......Marland KitchenTip  of little finger.  1 tbs......Marland KitchenMarland KitchenThumb.  2 tbs.......Marland KitchenGolf ball.   cup......Marland KitchenHalf of a fist.  1 cup.......Marland KitchenA fist. SAMPLE DIABETES MEAL PLAN Below is a sample meal plan that includes foods from the grain and starches, dairy, vegetable, fruit, and meat groups. A dietician can individualize a meal plan to fit your calorie needs and tell you the number of servings needed from each food group. However, controlling the total amount of carbohydrates in your meal or snack is more important than making sure you include all of the food groups at  every meal. You may interchange carbohydrate containing foods (dairy, starches, and fruits). The meal plan below is an example of a 2000 calorie diet using carbohydrate counting. This meal plan has 17 carbohydrate servings. Breakfast  1 cup oatmeal (2 carb servings).   cup light yogurt (1 carb serving).  1 cup blueberries (1 carb serving).   cup almonds. Snack  1 large apple (2 carb servings).  1 low-fat string cheese stick. Lunch  Chicken breast salad.  1 cup spinach.   cup chopped tomatoes.  2 oz chicken breast, sliced.  2 tbs low-fat [place] dressing.  12 whole-wheat crackers (2 carb servings).  12 to 15 grapes (1 carb serving).  1 cup low-fat milk (1 carb serving). Snack  1 cup carrots.   cup hummus (1 carb serving). Dinner  3 oz broiled salmon.  1 cup brown rice (3 carb servings). Snack  1  cups steamed broccoli (1 carb serving) drizzled with 1 tsp olive oil and lemon juice.  1 cup light pudding (2 carb servings). DIABETES MEAL PLANNING WORKSHEET Your dietician can use this worksheet to help you decide how many servings of foods and what types of foods are right for you.  BREAKFAST Food Group and Servings / Carb Servings Grain/Starches __________________________________ Dairy __________________________________________ Vegetable ______________________________________ Fruit ___________________________________________ Meat __________________________________________ Fat ____________________________________________ LUNCH Food Group and Servings / Carb Servings Grain/Starches ___________________________________ Dairy ___________________________________________ Fruit ____________________________________________ Meat ___________________________________________ Fat _____________________________________________ W[employer] Food Group and Servings / Carb Servings Grain/Starches ___________________________________ Dairy  ___________________________________________ Fruit ____________________________________________ Meat ___________________________________________ Fat _____________________________________________ SNACKS Food Group and Servings / Carb Servings Grain/Starches ___________________________________ Dairy ___________________________________________ Vegetable _______________________________________ Fruit ____________________________________________ Meat ___________________________________________ Fat _____________________________________________ DAILY TOTALS Starches _________________________ Vegetable ________________________ Fruit ____________________________ Dairy ____________________________ Meat ____________________________ Fat ______________________________ Document Released: 04/22/2005 Document Revised: 10/18/2011 Document Reviewed: 03/03/2009 [employer] Patient Information 2014 [employer]. PREDiabetes and Exercise Exercising regularly is important. It is not just about losing weight. It has many health benefits, such as:  Improving your overall fitness, flexibility, and endurance.  Increasing your bone density.  Helping with weight control.  Decreasing your body fat.  Increasing your muscle strength.  Reducing stress and tension.  Improving your overall health. People with diabetes who exercise gain additional benefits because exercise:  Reduces appetite.  Improves the body's use of blood sugar (glucose).  Helps lower or control blood glucose.  Decreases blood pressure.  Helps control blood lipids (such as cholesterol and triglycerides).  Improves the body's use of the hormone insulin by:  Increasing the body's insulin sensitivity.  Reducing the body's insulin needs.  Decreases the risk for heart disease because exercising:  Lowers cholesterol and triglycerides levels.  Increases the levels of good cholesterol (such as high-density lipoproteins [HDL]) in  the body.  Lowers blood glucose levels. YOUR ACTIVITY PLAN  Choose an activity that you enjoy and set realistic goals. Your health care provider or diabetes educator can help you make an activity plan that works for you. You  can break activities into 2 or 3 sessions throughout the day. Doing so is as good as one long session. Exercise ideas include:  Taking the dog for a walk.  Taking the stairs instead of the elevator.  Dancing to your favorite song.  Doing your favorite exercise with a friend. RECOMMENDATIONS FOR EXERCISING WITH TYPE 1 OR TYPE 2 DIABETES   Check your blood glucose before exercising. If blood glucose levels are greater than 240 mg/dL, check for urine ketones. Do not exercise if ketones are present.  Avoid injecting insulin into areas of the body that are going to be exercised. For example, avoid injecting insulin into:  The arms when playing tennis.  The legs when jogging.  Keep a record of:  Food intake before and after you exercise.  Expected peak times of insulin action.  Blood glucose levels before and after you exercise.  The type and amount of exercise you have done.  Review your records with your health care provider. Your health care provider will help you to develop guidelines for adjusting food intake and insulin amounts before and after exercising.  If you take insulin or oral hypoglycemic agents, watch for signs and symptoms of hypoglycemia. They include:  Dizziness.  Shaking.  Sweating.  Chills.  Confusion.  Drink plenty of water while you exercise to prevent dehydration or heat stroke. Body water is lost during exercise and must be replaced.  Talk to your health care provider before starting an exercise program to make sure it is safe for you. Remember, almost any type of activity is better than none. Document Released: 10/16/2003 Document Revised: 03/28/2013 Document Reviewed: 01/02/2013 Exit[employer] Patient Information 2014 [employer].  

## 2013-08-23 NOTE — Progress Notes (Signed)
   Subjective:    Patient ID: Ashley Boyle, female    DOB: 10-23-1960, 53 y.o.   MRN: [id number]  HPI Comments: 53 yo mildly obese female presents for 3 month F/U for HTN, Cholesterol, Pre-Dm, D. Deficient. LAST LABS T 178 TG 106 L 117 A1C 5.7 D 35 MAG 1.9 SHE HAS NOT BEEN EXERCISING SINCE GOUT FLARE. She has not had any recent gout flares, she has been off RX. She is eating descent except holidays, breads, and sweets. She notes BP is good at home.   Current Outpatient Prescriptions on File Prior to Visit  Medication Sig Dispense Refill  . furosemide (LASIX) 20 MG tablet Take 1 tablet (20 mg total) by mouth daily.  30 tablet  11  . LIVALO 4 MG TABS TAKE 1 TABLET BY MOUTH DAILY  30 tablet  2  . losartan (COZAAR) 100 MG tablet Take 100 mg by mouth daily.       No current facility-administered medications on file prior to visit.   ALLERGIES Crestor; Lipitor; and Vytorin  Past Medical History  Diagnosis Date  . Hypertension   . High cholesterol   . Obesity, unspecified   . Other abnormal glucose   . 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 Gout       Review of Systems  All other systems reviewed and are negative.   BP 122/78  Pulse 64  Temp(Src) 98.2 F (36.8 C) (Temporal)  Resp 18  Ht 5' 3.5" (1.613 m)  Wt 206 lb (93.441 kg)  BMI 35.91 kg/m2  LMP 08/09/2006     Objective:   Physical Exam  Nursing note and vitals reviewed. Constitutional: She is oriented to person, place, and time. She appears well-developed and well-nourished. No distress.  HENT:  Head: Normocephalic and atraumatic.  Right Ear: External ear normal.  Left Ear: External ear normal.  Nose: Nose normal.  Mouth/Throat: Oropharynx is clear and moist.  Eyes: Conjunctivae and EOM are normal.  Neck: Normal range of motion. Neck supple. No JVD present. No thyromegaly present.  Cardiovascular: Normal rate, regular rhythm, normal heart sounds and intact distal pulses.   Pulmonary/Chest:  Effort normal and breath sounds normal.  Abdominal: Soft. Bowel sounds are normal. She exhibits no distension and no mass. There is no tenderness. There is no rebound and no guarding.  Musculoskeletal: Normal range of motion. She exhibits no edema and no tenderness.  Lymphadenopathy:    She has no cervical adenopathy.  Neurological: She is alert and oriented to person, place, and time. No cranial nerve deficit.  Skin: Skin is warm and dry. No rash noted. No erythema. No pallor.  Psychiatric: She has a normal mood and affect. Her behavior is normal. Judgment and thought content normal.          Assessment & Plan:  1.  3 month F/U for OBESE, HTN, Cholesterol, Pre-Dm, D. Deficient. Needs healthy diet, cardio QD and obtain healthy weight. Check Labs, Check BP if >130/80 call office 2. Gout- recheck labs

## 2013-08-23 NOTE — Telephone Encounter (Signed)
You are correct we have to know what her insurance will cover.  I bet the price increase was for the Prometrium = not the Estrace ??  There is a generic progesterone with Provera that may be less expensive.

## 2013-08-24 LAB — HEPATIC FUNCTION PANEL
ALT: 19 U/L (ref 0–35)
AST: 17 U/L (ref 0–37)
Albumin: 4.3 g/dL (ref 3.5–5.2)
Alkaline Phosphatase: 53 U/L (ref 39–117)
Bilirubin, Direct: 0.1 mg/dL (ref 0.0–0.3)
Indirect Bilirubin: 0.5 mg/dL (ref 0.0–0.9)
Total Bilirubin: 0.6 mg/dL (ref 0.3–1.2)
Total Protein: 6.9 g/dL (ref 6.0–8.3)

## 2013-08-24 LAB — BASIC METABOLIC PANEL WITH GFR
BUN: 15 mg/dL (ref 6–23)
CO2: 25 mEq/L (ref 19–32)
Calcium: 9.7 mg/dL (ref 8.4–10.5)
Chloride: 105 mEq/L (ref 96–112)
Creat: 1.5 mg/dL — ABNORMAL HIGH (ref 0.50–1.10)
GFR, Est African American: 46 mL/min — ABNORMAL LOW
GFR, Est Non African American: 39 mL/min — ABNORMAL LOW
Glucose, Bld: 83 mg/dL (ref 70–99)
Potassium: 3.9 mEq/L (ref 3.5–5.3)
Sodium: 141 mEq/L (ref 135–145)

## 2013-08-24 LAB — URIC ACID: Uric Acid, Serum: 7.6 mg/dL — ABNORMAL HIGH (ref 2.4–7.0)

## 2013-08-24 LAB — LIPID PANEL
Cholesterol: 176 mg/dL (ref 0–200)
HDL: 44 mg/dL (ref >39)
LDL Cholesterol: 119 mg/dL — ABNORMAL HIGH (ref 0–99)
Total CHOL/HDL Ratio: 4 Ratio
Triglycerides: 63 mg/dL (ref <150)
VLDL: 13 mg/dL (ref 0–40)

## 2013-08-24 LAB — MAGNESIUM: Magnesium: 1.5 mg/dL (ref 1.5–2.5)

## 2013-08-24 LAB — INSULIN, FASTING: Insulin fasting, serum: 11 u[IU]/mL (ref 3–28)

## 2013-08-24 LAB — VITAMIN D 25 HYDROXY (VIT D DEFICIENCY, FRACTURES): Vit D, 25-Hydroxy: 48 ng/mL (ref 30–89)

## 2013-11-09 ENCOUNTER — Ambulatory Visit: Payer: Self-pay | Admitting: Emergency Medicine

## 2013-11-09 ENCOUNTER — Emergency Department (INDEPENDENT_AMBULATORY_CARE_PROVIDER_SITE_OTHER)
Admission: EM | Admit: 2013-11-09 | Discharge: 2013-11-09 | Disposition: A | Discharge status: Home / Self Care | Payer: 59 | Source: Home / Self Care | Attending: Family Medicine | Admitting: Family Medicine

## 2013-11-09 ENCOUNTER — Encounter (HOSPITAL_COMMUNITY): Payer: Self-pay | Admitting: Emergency Medicine

## 2013-11-09 DIAGNOSIS — L03119 Cellulitis of unspecified part of limb: Secondary | ICD-10-CM

## 2013-11-09 DIAGNOSIS — L02419 Cutaneous abscess of limb, unspecified: Secondary | ICD-10-CM

## 2013-11-09 MED ORDER — HYDROCODONE-ACETAMINOPHEN 5-325 MG PO TABS: 1.0000 | ORAL_TABLET | ORAL | Status: DC | PRN

## 2013-11-09 MED ORDER — DOXYCYCLINE HYCLATE 100 MG PO TABS
200.0000 mg | ORAL_TABLET | Freq: Once | ORAL | Status: AC
  Administered 2013-11-09: 200 mg via ORAL

## 2013-11-09 MED ORDER — DOXYCYCLINE HYCLATE 100 MG PO TABS
ORAL_TABLET | ORAL | Status: AC
  Filled 2013-11-09: qty 2

## 2013-11-09 MED ORDER — CEFUROXIME AXETIL 500 MG PO TABS: 500.0000 mg | ORAL_TABLET | Freq: Two times a day (BID) | ORAL | Status: DC

## 2013-11-09 MED ORDER — SULFAMETHOXAZOLE-TRIMETHOPRIM 800-160 MG PO TABS: 2.0000 | ORAL_TABLET | Freq: Two times a day (BID) | ORAL | Status: DC

## 2013-11-09 NOTE — ED Notes (Signed)
Pt  Reports    Pain r  Thigh  With  Redness  Swollen tender  Area      For  sev days      She reports headache  As  Well      Pt     denys  Any injury pt   denys  Any  Similar  Symptoms  In the  Past

## 2013-11-09 NOTE — Discharge Instructions (Signed)
Abscess Care After An abscess (also called a boil or furuncle) is an infected area that contains a collection of pus. Signs and symptoms of an abscess include pain, tenderness, redness, or hardness, or you may feel a moveable soft area under your skin. An abscess can occur anywhere in the body. The infection may spread to surrounding tissues causing cellulitis. A cut (incision) by the surgeon was made over your abscess and the pus was drained out. Gauze may have been packed into the space to provide a drain that will allow the cavity to heal from the inside outwards. The boil may be painful for 5 to 7 days. Most people with a boil do not have high fevers. Your abscess, if seen early, may not have localized, and may not have been lanced. If not, another appointment may be required for this if it does not get better on its own or with medications. HOME CARE INSTRUCTIONS   Only take over-the-counter or prescription medicines for pain, discomfort, or fever as directed by your caregiver.  When you bathe, soak and then remove gauze or iodoform packs at least daily or as directed by your caregiver. You may then wash the wound gently with mild soapy water. Repack with gauze or do as your caregiver directs. SEEK IMMEDIATE MEDICAL CARE IF:   You develop increased pain, swelling, redness, drainage, or bleeding in the wound site.  You develop signs of generalized infection including muscle aches, chills, fever, or a general ill feeling.  An oral temperature above 102 F (38.9 C) develops, not controlled by medication. See your caregiver for a recheck if you develop any of the symptoms described above. If medications (antibiotics) were prescribed, take them as directed. Document Released: 02/11/2005 Document Revised: 10/18/2011 Document Reviewed: 10/09/2007 [facility] Patient Information 2014 [place].  Cellulitis Cellulitis is an infection of the skin and the tissue beneath it. The infected area is  usually red and tender. Cellulitis occurs most often in the arms and lower legs.  CAUSES  Cellulitis is caused by bacteria that enter the skin through cracks or cuts in the skin. The most common types of bacteria that cause cellulitis are Staphylococcus and Streptococcus. SYMPTOMS   Redness and warmth.  Swelling.  Tenderness or pain.  Fever. DIAGNOSIS  Your caregiver can usually determine what is wrong based on a physical exam. Blood tests may also be done. TREATMENT  Treatment usually involves taking an antibiotic medicine. HOME CARE INSTRUCTIONS   Take your antibiotics as directed. Finish them even if you start to feel better.  Keep the infected arm or leg elevated to reduce swelling.  Apply a warm cloth to the affected area up to 4 times per day to relieve pain.  Only take over-the-counter or prescription medicines for pain, discomfort, or fever as directed by your caregiver.  Keep all follow-up appointments as directed by your caregiver. SEEK MEDICAL CARE IF:   You notice red streaks coming from the infected area.  Your red area gets larger or turns dark in color.  Your bone or joint underneath the infected area becomes painful after the skin has healed.  Your infection returns in the same area or another area.  You notice a swollen bump in the infected area.  You develop new symptoms. SEEK IMMEDIATE MEDICAL CARE IF:   You have a fever.  You feel very sleepy.  You develop vomiting or diarrhea.  You have a general ill feeling (malaise) with muscle aches and pains. MAKE SURE YOU:  Understand these instructions.  Will watch your condition.  Will get help right away if you are not doing well or get worse. Document Released: 05/05/2005 Document Revised: 01/25/2012 Document Reviewed: 10/11/2011 [facility] Patient Information 2014 [place].

## 2013-11-09 NOTE — ED Provider Notes (Signed)
CSN: [id number]     Arrival date & time 11/09/13  1106 History   First MD Initiated Contact with Patient 11/09/13 1312     Chief Complaint  Patient presents with  . Abscess   (Consider location/radiation/quality/duration/timing/severity/associated sxs/prior Treatment) HPI Comments: 53 year old female presents for evaluation of an area of redness and pain in her right upper posterior thigh. He first noticed this 2 days ago and it has been getting progressively worse. She has also started to have some body aches and subjective fevers. She has never had anything like this happen before. She denies feeling like and has no nausea or vomiting. No recent travel or sick contacts. No significant past medical history.   Past Medical History  Diagnosis Date  . Hypertension   . High cholesterol   . Obesity, unspecified   . Other abnormal glucose   . 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 Gout    Past Surgical History  Procedure Laterality Date  . Cosmetic surgery      breast reduction  . Breast surgery Bilateral 2007    reduction  . Colposcopy  108/10    negative, repeat pap normal  . Tubal ligation  1991   Family History  Problem Relation Age of Onset  . Hypertension Mother   . Cancer Mother     cervical  . Hyperlipidemia Mother   . Hypertension Father   . Hyperlipidemia Father   . Diabetes Other   . Cancer Other 40    breast  . Hyperlipidemia Brother   . Hypertension Brother    History  Substance Use Topics  . Smoking status: Former Smoker -- 0.10 packs/day for 5 years    Quit date: 06/21/1988  . Smokeless tobacco: Never Used  . Alcohol Use: No   OB History   Grav Para Term Preterm Abortions TAB SAB Ect Mult Living   2 2 2       2      Review of Systems  Constitutional: Positive for fever.  Gastrointestinal: Negative for nausea and vomiting.  Musculoskeletal: Positive for myalgias.  Skin: Positive for color change and rash.       See history of  present illness  Neurological: Negative for light-headedness.  All other systems reviewed and are negative.    Allergies  Crestor; Lipitor; and Vytorin  Home Medications   Current Outpatient Rx  Name  Route  Sig  Dispense  Refill  . allopurinol (ZYLOPRIM) 300 MG tablet   Oral   Take 300 mg by mouth daily as needed.         . cefUROXime (CEFTIN) 500 MG tablet   Oral   Take 1 tablet (500 mg total) by mouth 2 (two) times daily with a meal.   20 tablet   0   . furosemide (LASIX) 20 MG tablet   Oral   Take 1 tablet (20 mg total) by mouth daily.   30 tablet   11   . HYDROcodone-acetaminophen (NORCO) 5-325 MG per tablet   Oral   Take 1 tablet by mouth every 4 (four) hours as needed for moderate pain.   15 tablet   0   . LIVALO 4 MG TABS      TAKE 1 TABLET BY MOUTH DAILY   30 tablet   2     No refills available   . losartan (COZAAR) 100 MG tablet   Oral   Take 100 mg by  mouth daily.         Marland Kitchen sulfamethoxazole-trimethoprim (SEPTRA DS) 800-160 MG per tablet   Oral   Take 2 tablets by mouth every 12 (twelve) hours.   40 tablet   0    BP 149/77  Pulse 106  Temp(Src) 99.8 F (37.7 C) (Oral)  Resp 20  SpO2 97%  LMP 08/09/2006 Physical Exam  Nursing note and vitals reviewed. Constitutional: She is oriented to person, place, and time. Vital signs are normal. She appears well-developed and well-nourished. No distress.  HENT:  Head: Normocephalic and atraumatic.  Cardiovascular: Normal rate, regular rhythm and normal heart sounds.   Pulses:      Dorsalis pedis pulses are 2+ on the right side, and 2+ on the left side.  Pulmonary/Chest: Effort normal and breath sounds normal. No respiratory distress. She has no wheezes. She has no rales.  Abdominal: Soft. There is no tenderness.  Musculoskeletal:       Legs: Neurological: She is alert and oriented to person, place, and time. She has normal strength. Coordination normal.  Skin: Skin is warm and dry. No rash  noted. She is not diaphoretic.  Psychiatric: She has a normal mood and affect. Judgment normal.    ED Course  INCISION AND DRAINAGE Date/Time: 11/10/2013 7:44 PM Performed by: Allena Katz, H Authorized by: Allena Katz, H Consent: Verbal consent obtained. Risks and benefits: risks, benefits and alternatives were discussed Consent given by: patient Required items: required blood products, implants, devices, and special equipment available Patient identity confirmed: verbally with patient Time out: Immediately prior to procedure a "time out" was called to verify the correct patient, procedure, equipment, support staff and site/side marked as required. Type: abscess Body area: lower extremity Location details: right leg Anesthesia: local infiltration Local anesthetic: lidocaine 2% with epinephrine Anesthetic total: 5 ml Patient sedated: no Scalpel size: 11 Incision type: single straight Complexity: simple Drainage: purulent and  bloody Drainage amount: copious Packing material: 1/4 in iodoform gauze Patient tolerance: Patient tolerated the procedure well with no immediate complications.   (including critical care time) Labs Review Labs Reviewed  CULTURE, ROUTINE-ABSCESS   Imaging Review No results found.   MDM   1. Cellulitis and abscess of leg    Incised and drained, packed.  Will give her some doxy now to get something active against MRSA started and will d/c her with ceftin and bactrim, also pain meds.  She has a low grade fever and is feeling sick, i would like her to come back tomorrow just to have this rechecked and to check her vitals again.  ED if worsening acutely    Meds ordered this encounter  Medications  . doxycycline (VIBRA-TABS) tablet 200 mg    Sig:   . sulfamethoxazole-trimethoprim (SEPTRA DS) 800-160 MG per tablet    Sig: Take 2 tablets by mouth every 12 (twelve) hours.    Dispense:  40 tablet    Refill:  0    Order Specific Question:   Supervising Provider    Answer:  Billy Fischer [phone]  . cefUROXime (CEFTIN) 500 MG tablet    Sig: Take 1 tablet (500 mg total) by mouth 2 (two) times daily with a meal.    Dispense:  20 tablet    Refill:  0    Order Specific Question:  Supervising Provider    Answer:  Billy Fischer [phone]  . HYDROcodone-acetaminophen (NORCO) 5-325 MG per tablet    Sig: Take 1 tablet by mouth every 4 (  four) hours as needed for moderate pain.    Dispense:  15 tablet    Refill:  0    Order Specific Question:  Supervising Provider    Answer:  Ihor Gully D Simpson, PA-C 11/10/13 [phone]

## 2013-11-10 ENCOUNTER — Encounter (HOSPITAL_COMMUNITY): Payer: Self-pay | Admitting: Emergency Medicine

## 2013-11-10 ENCOUNTER — Emergency Department (INDEPENDENT_AMBULATORY_CARE_PROVIDER_SITE_OTHER)
Admission: EM | Admit: 2013-11-10 | Discharge: 2013-11-10 | Disposition: A | Discharge status: Home / Self Care | Payer: 59 | Source: Home / Self Care | Attending: Family Medicine | Admitting: Family Medicine

## 2013-11-10 DIAGNOSIS — Z09 Encounter for follow-up examination after completed treatment for conditions other than malignant neoplasm: Secondary | ICD-10-CM

## 2013-11-10 DIAGNOSIS — Z4800 Encounter for change or removal of nonsurgical wound dressing: Secondary | ICD-10-CM

## 2013-11-10 NOTE — ED Provider Notes (Signed)
CSN: [id number]     Arrival date & time 11/10/13  [id number] History   First MD Initiated Contact with Patient 11/10/13 1104     Chief Complaint  Patient presents with  . Follow-up   (Consider location/radiation/quality/duration/timing/severity/associated sxs/prior Treatment) Patient is a 53 y.o. female presenting with wound check. The history is provided by the patient.  Wound Check This is a new problem. The current episode started yesterday. The problem has been gradually improving. Associated symptoms comments: Sx improving.    Past Medical History  Diagnosis Date  . Hypertension   . High cholesterol   . Obesity, unspecified   . Other abnormal glucose   . 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 Gout    Past Surgical History  Procedure Laterality Date  . Cosmetic surgery      breast reduction  . Breast surgery Bilateral 2007    reduction  . Colposcopy  108/10    negative, repeat pap normal  . Tubal ligation  1991   Family History  Problem Relation Age of Onset  . Hypertension Mother   . Cancer Mother     cervical  . Hyperlipidemia Mother   . Hypertension Father   . Hyperlipidemia Father   . Diabetes Other   . Cancer Other 40    breast  . Hyperlipidemia Brother   . Hypertension Brother    History  Substance Use Topics  . Smoking status: Former Smoker -- 0.10 packs/day for 5 years    Quit date: 06/21/1988  . Smokeless tobacco: Never Used  . Alcohol Use: No   OB History   Grav Para Term Preterm Abortions TAB SAB Ect Mult Living   2 2 2       2      Review of Systems  Constitutional: Negative.   Skin: Positive for wound.    Allergies  Crestor; Lipitor; and Vytorin  Home Medications   Current Outpatient Rx  Name  Route  Sig  Dispense  Refill  . allopurinol (ZYLOPRIM) 300 MG tablet   Oral   Take 300 mg by mouth daily as needed.         . cefUROXime (CEFTIN) 500 MG tablet   Oral   Take 1 tablet (500 mg total) by mouth 2 (two)  times daily with a meal.   20 tablet   0   . furosemide (LASIX) 20 MG tablet   Oral   Take 1 tablet (20 mg total) by mouth daily.   30 tablet   11   . HYDROcodone-acetaminophen (NORCO) 5-325 MG per tablet   Oral   Take 1 tablet by mouth every 4 (four) hours as needed for moderate pain.   15 tablet   0   . LIVALO 4 MG TABS      TAKE 1 TABLET BY MOUTH DAILY   30 tablet   2     No refills available   . losartan (COZAAR) 100 MG tablet   Oral   Take 100 mg by mouth daily.         Marland Kitchen sulfamethoxazole-trimethoprim (SEPTRA DS) 800-160 MG per tablet   Oral   Take 2 tablets by mouth every 12 (twelve) hours.   40 tablet   0    BP 131/80  Pulse 79  Temp(Src) 98.5 F (36.9 C) (Oral)  Resp 18  SpO2 100%  LMP 08/09/2006 Physical Exam  Nursing note and vitals reviewed. Constitutional: She is  oriented to person, place, and time. She appears well-developed and well-nourished. No distress.  Neurological: She is alert and oriented to person, place, and time.  Skin: Skin is warm and dry.  I+d site examined, much improved. dsg changed.    ED Course  Procedures (including critical care time) Labs Review Labs Reviewed - No data to display Imaging Review No results found.   MDM   1. Encounter for recheck of abscess following incision and drainage    Min purulent fluid expressed, marked decreased induration and tenderness.dsd changed.    Billy Fischer, MD 11/10/13 1136

## 2013-11-10 NOTE — Discharge Instructions (Signed)
Care as discussed, return on mon for packing removal.

## 2013-11-10 NOTE — ED Notes (Signed)
Seen  Yesterday  For  An   I   AND  D       HERE  TODAY  FOR  RECHECK  AND  FOLLOWUP

## 2013-11-12 ENCOUNTER — Encounter (HOSPITAL_COMMUNITY): Payer: Self-pay | Admitting: Emergency Medicine

## 2013-11-12 ENCOUNTER — Telehealth (HOSPITAL_COMMUNITY): Payer: Self-pay | Admitting: *Deleted

## 2013-11-12 ENCOUNTER — Emergency Department (INDEPENDENT_AMBULATORY_CARE_PROVIDER_SITE_OTHER)
Admission: EM | Admit: 2013-11-12 | Discharge: 2013-11-12 | Disposition: A | Discharge status: Home / Self Care | Payer: 59 | Source: Home / Self Care | Attending: Family Medicine | Admitting: Family Medicine

## 2013-11-12 DIAGNOSIS — Z5189 Encounter for other specified aftercare: Secondary | ICD-10-CM

## 2013-11-12 DIAGNOSIS — L02419 Cutaneous abscess of limb, unspecified: Secondary | ICD-10-CM

## 2013-11-12 DIAGNOSIS — Z4800 Encounter for change or removal of nonsurgical wound dressing: Secondary | ICD-10-CM

## 2013-11-12 DIAGNOSIS — L03119 Cellulitis of unspecified part of limb: Secondary | ICD-10-CM

## 2013-11-12 LAB — CULTURE, ROUTINE-ABSCESS: Gram Stain: NONE SEEN

## 2013-11-12 NOTE — Discharge Instructions (Signed)
Soak in warm water 3-4 times daily.  Abscess Care After An abscess (also called a boil or furuncle) is an infected area that contains a collection of pus. Signs and symptoms of an abscess include pain, tenderness, redness, or hardness, or you may feel a moveable soft area under your skin. An abscess can occur anywhere in the body. The infection may spread to surrounding tissues causing cellulitis. A cut (incision) by the surgeon was made over your abscess and the pus was drained out. Gauze may have been packed into the space to provide a drain that will allow the cavity to heal from the inside outwards. The boil may be painful for 5 to 7 days. Most people with a boil do not have high fevers. Your abscess, if seen early, may not have localized, and may not have been lanced. If not, another appointment may be required for this if it does not get better on its own or with medications. HOME CARE INSTRUCTIONS   Only take over-the-counter or prescription medicines for pain, discomfort, or fever as directed by your caregiver.  When you bathe, soak and then remove gauze or iodoform packs at least daily or as directed by your caregiver. You may then wash the wound gently with mild soapy water. Repack with gauze or do as your caregiver directs. SEEK IMMEDIATE MEDICAL CARE IF:   You develop increased pain, swelling, redness, drainage, or bleeding in the wound site.  You develop signs of generalized infection including muscle aches, chills, fever, or a general ill feeling.  An oral temperature above 102 F (38.9 C) develops, not controlled by medication. See your caregiver for a recheck if you develop any of the symptoms described above. If medications (antibiotics) were prescribed, take them as directed. Document Released: 02/11/2005 Document Revised: 10/18/2011 Document Reviewed: 10/09/2007 [facility] Patient Information 2014 [place].  Cellulitis Cellulitis is an infection of the skin and the  tissue beneath it. The infected area is usually red and tender. Cellulitis occurs most often in the arms and lower legs.  CAUSES  Cellulitis is caused by bacteria that enter the skin through cracks or cuts in the skin. The most common types of bacteria that cause cellulitis are Staphylococcus and Streptococcus. SYMPTOMS   Redness and warmth.  Swelling.  Tenderness or pain.  Fever. DIAGNOSIS  Your caregiver can usually determine what is wrong based on a physical exam. Blood tests may also be done. TREATMENT  Treatment usually involves taking an antibiotic medicine. HOME CARE INSTRUCTIONS   Take your antibiotics as directed. Finish them even if you start to feel better.  Keep the infected arm or leg elevated to reduce swelling.  Apply a warm cloth to the affected area up to 4 times per day to relieve pain.  Only take over-the-counter or prescription medicines for pain, discomfort, or fever as directed by your caregiver.  Keep all follow-up appointments as directed by your caregiver. SEEK MEDICAL CARE IF:   You notice red streaks coming from the infected area.  Your red area gets larger or turns dark in color.  Your bone or joint underneath the infected area becomes painful after the skin has healed.  Your infection returns in the same area or another area.  You notice a swollen bump in the infected area.  You develop new symptoms. SEEK IMMEDIATE MEDICAL CARE IF:   You have a fever.  You feel very sleepy.  You develop vomiting or diarrhea.  You have a general ill feeling (malaise) with  muscle aches and pains. MAKE SURE YOU:   Understand these instructions.  Will watch your condition.  Will get help right away if you are not doing well or get worse. Document Released: 05/05/2005 Document Revised: 01/25/2012 Document Reviewed: 10/11/2011 [facility] Patient Information 2014 [place].

## 2013-11-12 NOTE — ED Provider Notes (Signed)
Medical screening examination/treatment/procedure(s) were performed by resident physician or non-physician practitioner and as supervising physician I was immediately available for consultation/collaboration.   Pauline Good MD.   Billy Fischer, MD 11/12/13 2118

## 2013-11-12 NOTE — ED Provider Notes (Signed)
CSN: [id number]     Arrival date & time 11/12/13  1734 History   First MD Initiated Contact with Patient 11/12/13 1845     Chief Complaint  Patient presents with  . Follow-up   (Consider location/radiation/quality/duration/timing/severity/associated sxs/prior Treatment) HPI Comments: 53 year old female presents for followup. She had abscess and cellulitis of her upper posterior right leg that was incised and drained initially 3 days ago. She is feeling a lot better. Pain is significantly resolved, there is still some soreness. No systemic symptoms. The wound is still draining.   Past Medical History  Diagnosis Date  . Hypertension   . High cholesterol   . Obesity, unspecified   . Other abnormal glucose   . 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 Gout    Past Surgical History  Procedure Laterality Date  . Cosmetic surgery      breast reduction  . Breast surgery Bilateral 2007    reduction  . Colposcopy  108/10    negative, repeat pap normal  . Tubal ligation  1991   Family History  Problem Relation Age of Onset  . Hypertension Mother   . Cancer Mother     cervical  . Hyperlipidemia Mother   . Hypertension Father   . Hyperlipidemia Father   . Diabetes Other   . Cancer Other 40    breast  . Hyperlipidemia Brother   . Hypertension Brother    History  Substance Use Topics  . Smoking status: Former Smoker -- 0.10 packs/day for 5 years    Quit date: 06/21/1988  . Smokeless tobacco: Never Used  . Alcohol Use: No   OB History   Grav Para Term Preterm Abortions TAB SAB Ect Mult Living   2 2 2       2      Review of Systems  Skin: Positive for color change and wound.  All other systems reviewed and are negative.    Allergies  Crestor; Lipitor; and Vytorin  Home Medications   Current Outpatient Rx  Name  Route  Sig  Dispense  Refill  . allopurinol (ZYLOPRIM) 300 MG tablet   Oral   Take 300 mg by mouth daily as needed.         .  cefUROXime (CEFTIN) 500 MG tablet   Oral   Take 1 tablet (500 mg total) by mouth 2 (two) times daily with a meal.   20 tablet   0   . furosemide (LASIX) 20 MG tablet   Oral   Take 1 tablet (20 mg total) by mouth daily.   30 tablet   11   . LIVALO 4 MG TABS      TAKE 1 TABLET BY MOUTH DAILY   30 tablet   2     No refills available   . losartan (COZAAR) 100 MG tablet   Oral   Take 100 mg by mouth daily.         Marland Kitchen sulfamethoxazole-trimethoprim (SEPTRA DS) 800-160 MG per tablet   Oral   Take 2 tablets by mouth every 12 (twelve) hours.   40 tablet   0   . HYDROcodone-acetaminophen (NORCO) 5-325 MG per tablet   Oral   Take 1 tablet by mouth every 4 (four) hours as needed for moderate pain.   15 tablet   0    BP 104/72  Pulse 77  Temp(Src) 98.8 F (37.1 C) (Oral)  LMP 08/09/2006 Physical Exam  Nursing note and vitals reviewed. Constitutional: She is oriented to person, place, and time. Vital signs are normal. She appears well-developed and well-nourished. No distress.  HENT:  Head: Normocephalic and atraumatic.  Pulmonary/Chest: Effort normal. No respiratory distress.  Musculoskeletal:       Legs: Neurological: She is alert and oriented to person, place, and time. She has normal strength. Coordination normal.  Skin: Skin is warm and dry. No rash noted. She is not diaphoretic.  Psychiatric: She has a normal mood and affect. Judgment normal.    ED Course  Procedures (including critical care time) Labs Review Labs Reviewed - No data to display Imaging Review No results found.   MDM   1. Cellulitis and abscess of leg   2. Wound check, abscess    Packing has fallen out but the wound is still open and draining.  Warm soaks TID.  Finish bactrim, stop ceftin.  F/u PRN        Liam Graham, PA-C 11/12/13 [phone]

## 2013-11-12 NOTE — ED Provider Notes (Signed)
Medical screening examination/treatment/procedure(s) were performed by resident physician or non-physician practitioner and as supervising physician I was immediately available for consultation/collaboration.   Pauline Good MD.   Billy Fischer, MD 11/12/13 2123

## 2013-11-12 NOTE — ED Notes (Signed)
Pt here for follow up of cellulitis of the right leg.   Voices no concerns at this time.

## 2013-11-12 NOTE — ED Notes (Signed)
Abscess culture R leg: Mod. Staph. Aureus.  Pt. adequately treated with Bactrim DS.  Thedore Mins wrote to notify pt. She can stop the Ceftin. I called pt. Pt. verified x 2 and given result.  Pt. told she can stop the Ceftin but need to finish all of the Bactrim DS.  Pt.'s questions answered and she voiced understanding. Roselyn Meier 11/12/2013

## 2013-11-17 NOTE — ED Notes (Signed)
Pt called; reports allergic reaction to Septra given on 4/6 Reports a rash certain areas of body??? Denies SOB Adv pt to come in and get seen by a provider and to stop taking Septra Pt verbalized understanding.

## 2013-11-30 ENCOUNTER — Ambulatory Visit: Payer: Self-pay | Admitting: Emergency Medicine

## 2013-12-05 ENCOUNTER — Ambulatory Visit (INDEPENDENT_AMBULATORY_CARE_PROVIDER_SITE_OTHER): Payer: 59 | Admitting: Emergency Medicine

## 2013-12-05 ENCOUNTER — Encounter: Payer: Self-pay | Admitting: Emergency Medicine

## 2013-12-05 VITALS — BP 112/80 | HR 72 | Temp 98.4°F | Resp 18 | Ht 63.5 in | Wt 203.0 lb

## 2013-12-05 DIAGNOSIS — I10 Essential (primary) hypertension: Secondary | ICD-10-CM

## 2013-12-05 DIAGNOSIS — E782 Mixed hyperlipidemia: Secondary | ICD-10-CM

## 2013-12-05 DIAGNOSIS — M109 Gout, unspecified: Secondary | ICD-10-CM

## 2013-12-05 LAB — HEPATIC FUNCTION PANEL
ALT: 19 U/L (ref 0–35)
AST: 20 U/L (ref 0–37)
Albumin: 4 g/dL (ref 3.5–5.2)
Alkaline Phosphatase: 65 U/L (ref 39–117)
Bilirubin, Direct: 0.1 mg/dL (ref 0.0–0.3)
Indirect Bilirubin: 0.4 mg/dL (ref 0.2–1.2)
Total Bilirubin: 0.5 mg/dL (ref 0.2–1.2)
Total Protein: 6.7 g/dL (ref 6.0–8.3)

## 2013-12-05 LAB — CBC WITH DIFFERENTIAL/PLATELET
Basophils Absolute: 0.1 10*3/uL (ref 0.0–0.1)
Basophils Relative: 1 % (ref 0–1)
Eosinophils Absolute: 0.2 10*3/uL (ref 0.0–0.7)
Eosinophils Relative: 3 % (ref 0–5)
HCT: 40 % (ref 36.0–46.0)
Hemoglobin: 13.3 g/dL (ref 12.0–15.0)
Lymphocytes Relative: 44 % (ref 12–46)
Lymphs Abs: 2.3 10*3/uL (ref 0.7–4.0)
MCH: 30.4 pg (ref 26.0–34.0)
MCHC: 33.3 g/dL (ref 30.0–36.0)
MCV: 91.3 fL (ref 78.0–100.0)
Monocytes Absolute: 0.4 10*3/uL (ref 0.1–1.0)
Monocytes Relative: 8 % (ref 3–12)
Neutro Abs: 2.3 10*3/uL (ref 1.7–7.7)
Neutrophils Relative %: 44 % (ref 43–77)
Platelets: 286 10*3/uL (ref 150–400)
RBC: 4.38 MIL/uL (ref 3.87–5.11)
RDW: 14.4 % (ref 11.5–15.5)
WBC: 5.3 10*3/uL (ref 4.0–10.5)

## 2013-12-05 LAB — LIPID PANEL
Cholesterol: 210 mg/dL — ABNORMAL HIGH (ref 0–200)
HDL: 45 mg/dL (ref >39)
LDL Cholesterol: 147 mg/dL — ABNORMAL HIGH (ref 0–99)
Total CHOL/HDL Ratio: 4.7 Ratio
Triglycerides: 92 mg/dL (ref <150)
VLDL: 18 mg/dL (ref 0–40)

## 2013-12-05 LAB — BASIC METABOLIC PANEL WITH GFR
BUN: 15 mg/dL (ref 6–23)
CO2: 28 mEq/L (ref 19–32)
Calcium: 9.8 mg/dL (ref 8.4–10.5)
Chloride: 106 mEq/L (ref 96–112)
Creat: 1.44 mg/dL — ABNORMAL HIGH (ref 0.50–1.10)
GFR, Est African American: 48 mL/min — ABNORMAL LOW
GFR, Est Non African American: 41 mL/min — ABNORMAL LOW
Glucose, Bld: 88 mg/dL (ref 70–99)
Potassium: 4.4 mEq/L (ref 3.5–5.3)
Sodium: 141 mEq/L (ref 135–145)

## 2013-12-05 LAB — URIC ACID: Uric Acid, Serum: 5 mg/dL (ref 2.4–7.0)

## 2013-12-05 NOTE — Progress Notes (Signed)
Subjective:    Patient ID: Ashley Boyle, female    DOB: Nov 29, 1960, 53 y.o.   MRN: [id number]  HPI Comments: 53 yo AAF presents for 3 month F/U for HTN, Cholesterol, Pre-Dm, D. Deficient. She has not been exercising and eating healthy. CHOL         176   08/23/2013 HDL           44   08/23/2013 LDLCALC      119   08/23/2013 TRIG          63   08/23/2013 CHOLHDL      4.0   08/23/2013 ALT           19   08/23/2013 AST           17   08/23/2013 ALKPHOS       53   08/23/2013 BILITOT      0.6   08/23/2013 CREATININE     1.50   08/23/2013 BUN              15   08/23/2013 NA              141   08/23/2013 K               3.9   08/23/2013 CL              105   08/23/2013 CO2              25   08/23/2013 WBC      6.0   08/23/2013 HGB     13.6   08/23/2013 HCT     40.9   08/23/2013 MCV     95.8   08/23/2013 PLT      281   08/23/2013 HGBA1C      5.0   08/23/2013   Uric 7.6  She had abscess on right posterior thigh that was not MRSA by lab. She had allergic rxn to Bactrim with rash. OTC Zyrtec/ Cortisone relieved symptoms. Abscess has resolved. She does not recall how she obtained abscess without any injury/ change in history.  She denies any recent gout flares with allopurinol QD>     Medication List       This list is accurate as of: 12/05/13  4:27 PM.  Always use your most recent med list.               allopurinol 300 MG tablet  Commonly known as:  ZYLOPRIM  Take 300 mg by mouth daily as needed.     estradiol 0.1 MG/24HR patch  Commonly known as:  VIVELLE-DOT  Place 1 patch onto the skin daily.     furosemide 20 MG tablet  Commonly known as:  LASIX  Take 1 tablet (20 mg total) by mouth daily.     LIVALO 4 MG Tabs  Generic drug:  Pitavastatin Calcium  TAKE 1 TABLET BY MOUTH DAILY     losartan 100 MG tablet  Commonly known as:  COZAAR  Take 100 mg by mouth daily.     PROGESTERONE PO  Take by mouth daily.       Allergies  Allergen Reactions  . Bactrim [Sulfamethoxazole-Tmp Ds]     rash  . Ceftin [Cefuroxime]     rash  . Crestor [Rosuvastatin] Other (See Comments)    myalgias  . Lipitor [Atorvastatin] Other (See Comments)    myalgias  . Vytorin [Ezetimibe-Simvastatin] Other (See Comments)    myalgias  Past Medical History  Diagnosis Date  . Hypertension   . High cholesterol   . Obesity, unspecified   . Other abnormal glucose   . 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 Gout       Review of Systems  All other systems reviewed and are negative.  BP 112/80  Pulse 72  Temp(Src) 98.4 F (36.9 C) (Temporal)  Resp 18  Ht 5' 3.5" (1.613 m)  Wt 203 lb (92.08 kg)  BMI 35.39 kg/m2  LMP 08/09/2006     Objective:   Physical Exam  Nursing note and vitals reviewed. Constitutional: She is oriented to person, place, and time. She appears well-developed and well-nourished. No distress.  overweight  HENT:  Head: Normocephalic and atraumatic.  Right Ear: External ear normal.  Left Ear: External ear normal.  Nose: Nose normal.  Mouth/Throat: Oropharynx is clear and moist.  Eyes: Conjunctivae and EOM are normal.  Neck: Normal range of motion. Neck supple. No JVD present. No thyromegaly present.  Cardiovascular: Normal rate, regular rhythm, normal heart sounds and intact distal pulses.   Pulmonary/Chest: Effort normal and breath sounds normal.  Abdominal: Soft. Bowel sounds are normal. She exhibits no distension and no mass. There is no tenderness. There is no rebound and no guarding.  Musculoskeletal: Normal range of motion. She exhibits no edema and no tenderness.  Lymphadenopathy:    She has no cervical adenopathy.  Neurological: She is alert and oriented to person, place, and time. No cranial nerve deficit.  Skin: Skin is warm and dry. No rash noted. No erythema. No pallor.  Right posterior thigh with 1 cm scar healing appropriately w/o any sign of infection  Psychiatric: She has a normal mood and affect. Her behavior is normal.  Judgment and thought content normal.          Assessment & Plan:  1.  3 month F/U for HTN, Cholesterol. Needs healthy diet, cardio QD and obtain healthy weight. Check Labs, Check BP if >130/80 call office   2. Gout- recheck labs continue RX same  3.Recent abscess- continue to monitor and call with any concerns

## 2013-12-05 NOTE — Patient Instructions (Signed)

## 2013-12-07 ENCOUNTER — Emergency Department (INDEPENDENT_AMBULATORY_CARE_PROVIDER_SITE_OTHER)
Admission: EM | Admit: 2013-12-07 | Discharge: 2013-12-07 | Disposition: A | Discharge status: Home / Self Care | Payer: 59 | Source: Home / Self Care | Attending: Family Medicine | Admitting: Family Medicine

## 2013-12-07 ENCOUNTER — Encounter (HOSPITAL_COMMUNITY): Payer: Self-pay | Admitting: Emergency Medicine

## 2013-12-07 DIAGNOSIS — S61219A Laceration without foreign body of unspecified finger without damage to nail, initial encounter: Secondary | ICD-10-CM

## 2013-12-07 DIAGNOSIS — S61209A Unspecified open wound of unspecified finger without damage to nail, initial encounter: Secondary | ICD-10-CM

## 2013-12-07 NOTE — Discharge Instructions (Signed)
Ms. Monarrez,   I am sorry about the laceration, but glad you came in so we could stitch it up. Keep that dress on for 24 hours. Then you can remove it and clean it but don't soak or scrub it. Use antibiotic ointment and cover with a bandage. Come back in Wednesday or Thursday for suture removal.   Take Care,   Dr. Maricela Bo

## 2013-12-07 NOTE — ED Notes (Signed)
States trying to open icing packet earlier today, when knife slipped, and she cut left index finger. Laceration aprox 1 cm to palmar surface distal left index finger. Minimal bleeding at present

## 2013-12-07 NOTE — ED Provider Notes (Signed)
CSN: [id number]     Arrival date & time 12/07/13  1626 History   First MD Initiated Contact with Patient 12/07/13 1727     Chief Complaint  Patient presents with  . Extremity Laceration   (Consider location/radiation/quality/duration/timing/severity/associated sxs/prior Treatment) HPI  Laceration left index finger on distal palmar surface. 4 hours ago on kitchen knife while trying to open a package of food. Persistent bleeding despite pressure and elevation. Pt came to have wound closed. Last tetanus in 2004. Pt not taking aspirin or anti-coagulant.   Past Medical History  Diagnosis Date  . Hypertension   . High cholesterol   . Obesity, unspecified   . Other abnormal glucose   . 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 Gout    Past Surgical History  Procedure Laterality Date  . Cosmetic surgery      breast reduction  . Breast surgery Bilateral 2007    reduction  . Colposcopy  108/10    negative, repeat pap normal  . Tubal ligation  1991   Family History  Problem Relation Age of Onset  . Hypertension Mother   . Cancer Mother     cervical  . Hyperlipidemia Mother   . Hypertension Father   . Hyperlipidemia Father   . Diabetes Other   . Cancer Other 40    breast  . Hyperlipidemia Brother   . Hypertension Brother    History  Substance Use Topics  . Smoking status: Former Smoker -- 0.10 packs/day for 5 years    Quit date: 06/21/1988  . Smokeless tobacco: Never Used  . Alcohol Use: No   OB History   Grav Para Term Preterm Abortions TAB SAB Ect Mult Living   2 2 2       2      Review of Systems Negative unless stated in HPI Allergies  Bactrim; Ceftin; Crestor; Lipitor; and Vytorin  Home Medications   Prior to Admission medications   Medication Sig Start Date End Date Taking? Authorizing Provider  allopurinol (ZYLOPRIM) 300 MG tablet Take 300 mg by mouth daily as needed. 06/22/13   Melissa R Smith, PA-C  estradiol (VIVELLE-DOT) 0.1 MG/24HR  patch Place 1 patch onto the skin daily.    Historical Provider, MD  furosemide (LASIX) 20 MG tablet Take 1 tablet (20 mg total) by mouth daily. 07/09/13 07/09/14  Ardis Hughs, PA-C  LIVALO 4 MG TABS TAKE 1 TABLET BY MOUTH DAILY 07/14/13   Melissa R Smith, PA-C  losartan (COZAAR) 100 MG tablet Take 100 mg by mouth daily.    Historical Provider, MD  Progesterone Micronized (PROGESTERONE PO) Take by mouth daily.    Historical Provider, MD   BP 147/89  Pulse 78  Temp(Src) 98.5 F (36.9 C) (Oral)  Resp 16  SpO2 100%  LMP 08/09/2006 Physical Exam Gen :well appearing AAF, pleasant and conversant Skin: 2 cm laceration of distal palmar pad of index finger on left hand with profuse pulsatile bleeding, MSK: normal flexion and extension of all fingers on left hand Neuro: sensation intact to palm of left hand ED Course  Procedures (including critical care time) Labs Review Labs Reviewed - No data to display  Imaging Review No results found.   MDM   1. Laceration of finger    Laceration repair - Compression and electrocautery performed for hemostasis - Lidocaine 1% without epi used for anesthesia - Cleaned with betadine - Wound closed with 4-0 prolene, 5 sutures  placed - Pressure dressing applied - No complications and pt tolerated procedure well - EBL: 150 mL    Angelica Ran, MD 12/07/13 [phone]

## 2013-12-09 NOTE — ED Provider Notes (Signed)
Medical screening examination/treatment/procedure(s) were performed by a resident physician or non-physician practitioner and as the supervising physician I was immediately available for consultation/collaboration.  Lynne Leader, MD    Gregor Hams, MD 12/09/13 [phone]

## 2013-12-13 ENCOUNTER — Emergency Department (INDEPENDENT_AMBULATORY_CARE_PROVIDER_SITE_OTHER)
Admission: EM | Admit: 2013-12-13 | Discharge: 2013-12-13 | Disposition: A | Discharge status: Home / Self Care | Payer: 59 | Source: Home / Self Care

## 2013-12-13 ENCOUNTER — Encounter (HOSPITAL_COMMUNITY): Payer: Self-pay | Admitting: Emergency Medicine

## 2013-12-13 DIAGNOSIS — S61209A Unspecified open wound of unspecified finger without damage to nail, initial encounter: Secondary | ICD-10-CM

## 2013-12-13 DIAGNOSIS — Z4802 Encounter for removal of sutures: Secondary | ICD-10-CM

## 2013-12-13 DIAGNOSIS — S61219A Laceration without foreign body of unspecified finger without damage to nail, initial encounter: Secondary | ICD-10-CM

## 2013-12-13 NOTE — ED Provider Notes (Signed)
CSN: [id number]     Arrival date & time 12/13/13  1643 History   None    Chief Complaint  Patient presents with  . Suture / Staple Removal   (Consider location/radiation/quality/duration/timing/severity/associated sxs/prior Treatment) HPI  Extremity laceration on L index finger on 12/07/13. Using A&D ointment and daily peroxide cleansing. Reporting mild change in sensation in the tip of finger. No swelling or discharge. Pain in finger is improving. Denies fevers.    Past Medical History  Diagnosis Date  . Hypertension   . High cholesterol   . Obesity, unspecified   . Other abnormal glucose   . 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 Gout    Past Surgical History  Procedure Laterality Date  . Cosmetic surgery      breast reduction  . Breast surgery Bilateral 2007    reduction  . Colposcopy  108/10    negative, repeat pap normal  . Tubal ligation  1991   Family History  Problem Relation Age of Onset  . Hypertension Mother   . Cancer Mother     cervical  . Hyperlipidemia Mother   . Hypertension Father   . Hyperlipidemia Father   . Diabetes Other   . Cancer Other 40    breast  . Hyperlipidemia Brother   . Hypertension Brother    History  Substance Use Topics  . Smoking status: Former Smoker -- 0.10 packs/day for 5 years    Quit date: 06/21/1988  . Smokeless tobacco: Never Used  . Alcohol Use: No   OB History   Grav Para Term Preterm Abortions TAB SAB Ect Mult Living   2 2 2       2      Review of Systems  Constitutional: Negative for fever.  Skin: Negative for rash.  All other systems reviewed and are negative.   Allergies  Bactrim; Ceftin; Crestor; Lipitor; and Vytorin  Home Medications   Prior to Admission medications   Medication Sig Start Date End Date Taking? Authorizing Provider  allopurinol (ZYLOPRIM) 300 MG tablet Take 300 mg by mouth daily as needed. 06/22/13   Melissa R Smith, PA-C  estradiol (VIVELLE-DOT) 0.1 MG/24HR  patch Place 1 patch onto the skin daily.    Historical Provider, MD  furosemide (LASIX) 20 MG tablet Take 1 tablet (20 mg total) by mouth daily. 07/09/13 07/09/14  Ardis Hughs, PA-C  LIVALO 4 MG TABS TAKE 1 TABLET BY MOUTH DAILY 07/14/13   Melissa R Smith, PA-C  losartan (COZAAR) 100 MG tablet Take 100 mg by mouth daily.    Historical Provider, MD  Progesterone Micronized (PROGESTERONE PO) Take by mouth daily.    Historical Provider, MD   BP 148/83  Pulse 79  Temp(Src) 98.3 F (36.8 C) (Oral)  Resp 18  SpO2 98%  LMP 08/09/2006 Physical Exam  Constitutional: She is oriented to person, place, and time. She appears well-developed and well-nourished. No distress.  HENT:  Head: Normocephalic and atraumatic.  Eyes: EOM are normal. Pupils are equal, round, and reactive to light.  Neck: Normal range of motion.  Cardiovascular: Normal rate.   Pulmonary/Chest: Effort normal. No respiratory distress.  Abdominal: Soft.  Neurological: She is alert and oriented to person, place, and time.  Skin: She is not diaphoretic.  L index finger w/ laceration - see procedure note  Psychiatric: She has a normal mood and affect. Her behavior is normal. Judgment and thought content normal.  ED Course  Procedures (including critical care time) Labs Review Labs Reviewed - No data to display  Imaging Review No results found.  5 prolene sutures removed. Finger still w/ opening to below the level of the dermis. Wound cleansed and irrigated multiple times. No signs of infection and no foreign material. After lengthy discussion on allowing finger to remain open, splinting and allowing to heal by secondary intention vs dermabond pt wanted to go with dermabond. dermabond applied in 3 applications and allowed to dry prior to dishcarge   MDM   1. Finger laceration   2. Visit for suture removal    Laceration w/ poor healing. As discussed above dermabond applied. Precautions given and all qeustions answered.  No singns of infection   Linna Darner, MD Family Medicine PGY-3 12/13/2013, 6:15 PM      Waldemar Dickens, MD 12/13/13 [phone]

## 2013-12-13 NOTE — Discharge Instructions (Signed)
Your finger is healing without signs of infection We applied dermabond tonight after throurough cleaning This will take 1-2 weeks to come off Please come back if you develop any signs of infection (swelling, increasing pain, pussy discharge) The sensation in the end of the finger should slowly return

## 2013-12-13 NOTE — ED Notes (Signed)
C/o suture removal on left index finger States finger is still sensitive

## 2013-12-14 NOTE — ED Provider Notes (Signed)
Medical screening examination/treatment/procedure(s) were performed by a resident physician and as supervising physician I was immediately available for consultation/collaboration.  Philipp Deputy, M.D.  Harden Mo, MD 12/14/13 [phone]

## 2014-01-08 ENCOUNTER — Emergency Department (INDEPENDENT_AMBULATORY_CARE_PROVIDER_SITE_OTHER)
Admission: EM | Admit: 2014-01-08 | Discharge: 2014-01-08 | Disposition: A | Discharge status: Home / Self Care | Payer: 59 | Source: Home / Self Care | Attending: Family Medicine | Admitting: Family Medicine

## 2014-01-08 ENCOUNTER — Encounter (HOSPITAL_COMMUNITY): Payer: Self-pay | Admitting: Emergency Medicine

## 2014-01-08 DIAGNOSIS — L089 Local infection of the skin and subcutaneous tissue, unspecified: Secondary | ICD-10-CM

## 2014-01-08 DIAGNOSIS — R059 Cough, unspecified: Secondary | ICD-10-CM

## 2014-01-08 DIAGNOSIS — R05 Cough: Secondary | ICD-10-CM

## 2014-01-08 DIAGNOSIS — R52 Pain, unspecified: Secondary | ICD-10-CM

## 2014-01-08 MED ORDER — CLINDAMYCIN HCL 300 MG PO CAPS: 300.0000 mg | ORAL_CAPSULE | Freq: Three times a day (TID) | ORAL | Status: DC

## 2014-01-08 MED ORDER — VALACYCLOVIR HCL 1 G PO TABS: 1000.0000 mg | ORAL_TABLET | Freq: Three times a day (TID) | ORAL | Status: AC

## 2014-01-08 NOTE — ED Notes (Signed)
Patient complains of cough and chest congestion denies f/c; states boil on right thigh that has been treated at [facility] in past.

## 2014-01-08 NOTE — ED Provider Notes (Signed)
Medical screening examination/treatment/procedure(s) were performed by resident physician or non-physician practitioner and as supervising physician I was immediately available for consultation/collaboration.   Pauline Good MD.   Billy Fischer, MD 01/08/14 1556

## 2014-01-08 NOTE — ED Provider Notes (Signed)
CSN: [id number]     Arrival date & time 01/08/14  0912 History   None    Chief Complaint  Patient presents with  . Recurrent Skin Infections  . Cough   (Consider location/radiation/quality/duration/timing/severity/associated sxs/prior Treatment) HPI Comments: 53 year old female presents complaining of infection on her buttocks as well as a cough and body aches. The symptoms began on Saturday with the cough, body aches, and subjective fevers. Yesterday she began to notice some pain on her buttocks. She thinks she may have developed another abscess. She has not taken anything for treatment. She denies measured fever, shortness of breath, chest pain.  Patient is a 53 y.o. female presenting with cough.  Cough Associated symptoms: myalgias and rash   Associated symptoms: no shortness of breath     Past Medical History  Diagnosis Date  . Hypertension   . High cholesterol   . Obesity, unspecified   . Other abnormal glucose   . 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 Gout    Past Surgical History  Procedure Laterality Date  . Cosmetic surgery      breast reduction  . Breast surgery Bilateral 2007    reduction  . Colposcopy  108/10    negative, repeat pap normal  . Tubal ligation  1991   Family History  Problem Relation Age of Onset  . Hypertension Mother   . Cancer Mother     cervical  . Hyperlipidemia Mother   . Hypertension Father   . Hyperlipidemia Father   . Diabetes Other   . Cancer Other 40    breast  . Hyperlipidemia Brother   . Hypertension Brother    History  Substance Use Topics  . Smoking status: Former Smoker -- 0.10 packs/day for 5 years    Quit date: 06/21/1988  . Smokeless tobacco: Never Used  . Alcohol Use: No   OB History   Grav Para Term Preterm Abortions TAB SAB Ect Mult Living   2 2 2       2      Review of Systems  Respiratory: Positive for cough. Negative for chest tightness and shortness of breath.   Musculoskeletal:  Positive for arthralgias and myalgias.  Skin: Positive for rash.  All other systems reviewed and are negative.   Allergies  Bactrim; Ceftin; Crestor; Lipitor; and Vytorin  Home Medications   Prior to Admission medications   Medication Sig Start Date End Date Taking? Authorizing Provider  allopurinol (ZYLOPRIM) 300 MG tablet Take 300 mg by mouth daily as needed. 06/22/13  Yes Melissa R Smith, PA-C  estradiol (VIVELLE-DOT) 0.1 MG/24HR patch Place 1 patch onto the skin daily.   Yes Historical Provider, MD  furosemide (LASIX) 20 MG tablet Take 1 tablet (20 mg total) by mouth daily. 07/09/13 07/09/14 Yes Melissa R Smith, PA-C  LIVALO 4 MG TABS TAKE 1 TABLET BY MOUTH DAILY 07/14/13  Yes Melissa R Smith, PA-C  losartan (COZAAR) 100 MG tablet Take 100 mg by mouth daily.   Yes Historical Provider, MD  Progesterone Micronized (PROGESTERONE PO) Take by mouth daily.   Yes Historical Provider, MD  clindamycin (CLEOCIN) 300 MG capsule Take 1 capsule (300 mg total) by mouth 3 (three) times daily. 01/08/14   Liam Graham, PA-C  valACYclovir (VALTREX) 1000 MG tablet Take 1 tablet (1,000 mg total) by mouth 3 (three) times daily. 01/08/14 01/22/14  Freeman Caldron Baker, PA-C   BP 120/79  Pulse 96  Temp(Src)  98.9 F (37.2 C) (Oral)  Resp 18  SpO2 99%  LMP 08/09/2006 Physical Exam  Nursing note and vitals reviewed. Constitutional: She is oriented to person, place, and time. Vital signs are normal. She appears well-developed and well-nourished. No distress.  HENT:  Head: Normocephalic and atraumatic.  Cardiovascular: Normal rate, regular rhythm and normal heart sounds.  Exam reveals no gallop and no friction rub.   No murmur heard. Pulmonary/Chest: Effort normal and breath sounds normal. No respiratory distress. She has no wheezes. She has no rales.  Neurological: She is alert and oriented to person, place, and time. She has normal strength. Coordination normal.  Skin: Skin is warm and dry. No rash noted. She  is not diaphoretic.     Psychiatric: She has a normal mood and affect. Judgment normal.    ED Course  Procedures (including critical care time) Labs Review Labs Reviewed - No data to display  Imaging Review No results found.   MDM   1. Skin infection   2. Cough   3. Body aches    Herpes vs. Impetigo.  Swabs sent, will treat for both.  F/u if no improvement in a few days    Meds ordered this encounter  Medications  . clindamycin (CLEOCIN) 300 MG capsule    Sig: Take 1 capsule (300 mg total) by mouth 3 (three) times daily.    Dispense:  21 capsule    Refill:  0    Order Specific Question:  Supervising Provider    Answer:  Billy Fischer [phone]  . valACYclovir (VALTREX) 1000 MG tablet    Sig: Take 1 tablet (1,000 mg total) by mouth 3 (three) times daily.    Dispense:  21 tablet    Refill:  0    Order Specific Question:  Supervising Provider    Answer:  Ihor Gully D [[id number]]       Liam Graham, PA-C 01/08/14 1038

## 2014-01-10 LAB — WOUND CULTURE: Gram Stain: NONE SEEN

## 2014-01-10 LAB — HERPES SIMPLEX VIRUS CULTURE: Culture: NOT DETECTED

## 2014-01-11 NOTE — ED Notes (Signed)
Called and left message for patient to call to clarify patient question

## 2014-01-13 NOTE — ED Notes (Signed)
Wound culture gluteal cleft: few Staph. Aureus. Herpes culture: neg.  Pt. adequately treated with Cleocin. Hanley Seamen [place] 01/13/2014

## 2014-02-05 ENCOUNTER — Other Ambulatory Visit: Payer: Self-pay | Admitting: Emergency Medicine

## 2014-02-05 MED ORDER — PITAVASTATIN CALCIUM 4 MG PO TABS: ORAL_TABLET | ORAL | Status: DC

## 2014-02-05 MED ORDER — FUROSEMIDE 20 MG PO TABS: 20.0000 mg | ORAL_TABLET | Freq: Every day | ORAL | Status: DC

## 2014-02-05 MED ORDER — LOSARTAN POTASSIUM 100 MG PO TABS: 100.0000 mg | ORAL_TABLET | Freq: Every day | ORAL | Status: DC

## 2014-03-08 ENCOUNTER — Ambulatory Visit: Payer: Self-pay | Admitting: Emergency Medicine

## 2014-03-15 ENCOUNTER — Ambulatory Visit: Payer: Self-pay | Admitting: Physician Assistant

## 2014-05-07 ENCOUNTER — Telehealth: Payer: Self-pay | Admitting: Nurse Practitioner

## 2014-05-07 NOTE — Telephone Encounter (Signed)
Patient is having hot flashes and insomnia.

## 2014-05-07 NOTE — Telephone Encounter (Signed)
Spoke with patient. Patient states that she is currently on Progesterone 100mg  and Estradiol 5mg  daily. Patient has been experiencing increased hot flashes throughout the day and at night. "I toss and turn over and over and can't sleep. They last until 1 am." Patient bought OTC medication for menopause but is hesitant to try with elevated blood pressure. Advised patient will need to come in to see Milford Cage, FNP to discuss medication and symptoms. Patient is agreeable. Requests Friday appointment. Advised Milford Cage, FNP will be off this Friday and next. Requests to bring grandchild to appointment Monday. Advised it is against our policy to bring children to appointment for safety. Patient agreeable.Appointment scheduled for Thursday 10/8 at 4:15pm with Milford Cage, [place]. Agreeable to date and time.  Routing to provider for final review. Patient agreeable to disposition. Will close encounter

## 2014-05-16 ENCOUNTER — Encounter: Payer: Self-pay | Admitting: Nurse Practitioner

## 2014-05-16 ENCOUNTER — Ambulatory Visit (INDEPENDENT_AMBULATORY_CARE_PROVIDER_SITE_OTHER): Payer: 59 | Admitting: Nurse Practitioner

## 2014-05-16 VITALS — BP 124/80 | HR 72 | Ht 63.5 in | Wt 203.0 lb

## 2014-05-16 DIAGNOSIS — N951 Menopausal and female climacteric states: Secondary | ICD-10-CM

## 2014-05-16 MED ORDER — VALACYCLOVIR HCL 1 G PO TABS: 1000.0000 mg | ORAL_TABLET | Freq: Two times a day (BID) | ORAL | Status: DC

## 2014-05-16 MED ORDER — VALACYCLOVIR HCL 1 G PO TABS
1000.0000 mg | ORAL_TABLET | Freq: Two times a day (BID) | ORAL | Status: DC
Start: 2014-05-16 — End: 2014-05-16

## 2014-05-16 NOTE — Progress Notes (Signed)
Patient ID: Ashley Boyle, female   DOB: [birth year], 53 y.o.   MRN: [id number] S: This 53 yo MAA Fe comes in for a consult about HRT.  She did well on Estradiol 0.5 mg and Prometrium daily until about a month ago.  She had an increase in vaso symptoms with  Night sweats and hot flashes.  She then felt the HRT was not working and started on an herbal supplement that contained Melatonin.  Seemed to help for a short time.  Now vaso symptoms are horrible and she is unsure what to do.  She also now has an increase in vaginal dryness.  No other new health issues and no change of other med's.  Plan:   Increase to 1 mg of Estradiol po daily and continue with Prometrium 100 mg daily  She is to call back within 2 weeks if not helping  She will use OTC Replens twice weekly for vaginal dryness  Refill Valtrex  Consult time: 15 minutes

## 2014-05-16 NOTE — Patient Instructions (Addendum)
Atrophic Vaginitis Atrophic vaginitis is a problem of low levels of estrogen in women. This problem can happen at any age. It is most common in women who have gone through menopause ("the change").  HOW WILL I KNOW IF I HAVE THIS PROBLEM? You may have:  Trouble with peeing (urinating), such as:  Going to the bathroom often.  A hard time holding your pee until you reach a bathroom.  Leaking pee.  Having pain when you pee.  Itching or a burning feeling.  Vaginal bleeding and spotting.  Pain during sex.  Dryness of the vagina.  A yellow, bad-smelling fluid (discharge) coming from the vagina. HOW WILL MY DOCTOR CHECK FOR THIS PROBLEM?  During your exam, your doctor will likely find the problem.  If there is a vaginal fluid, it may be checked for infection. HOW WILL THIS PROBLEM BE TREATED? Keep the vulvar skin as clean as possible. Moisturizers and lubricants can help with some of the symptoms. Estrogen replacement can help. There are 2 ways to take estrogen:  Systemic estrogen gets estrogen to your whole body. It takes many weeks or months before the symptoms get better.  You take an estrogen pill.  You use a skin patch. This is a patch that you put on your skin.  If you still have your uterus, your doctor may ask you to take a hormone. Talk to your doctor about the right medicine for you.  Estrogen cream.  This puts estrogen only at the part of your body where you apply it. The cream is put into the vagina or put on the vulvar skin. For some women, estrogen cream works faster than pills or the patch. CAN ALL WOMEN WITH THIS PROBLEM USE ESTROGEN? No. Women with certain types of cancer, liver problems, or problems with blood clots should not take estrogen. Your doctor can help you decide the best treatment for your symptoms. Document Released: 01/12/2008 Document Revised: 07/31/2013 Document Reviewed: 01/12/2008 [facility] Patient Information 2015 [place], [place]. This  information is not intended to replace advice given to you by your health care provider. Make sure you discuss any questions you have with your health care provider.   Replens OTC Valtrex 1 GM - 1/2 tablet twice a day for 5 days then use as needed

## 2014-05-21 NOTE — Progress Notes (Signed)
Encounter reviewed by Dr. Brook Silva.  

## 2014-06-10 ENCOUNTER — Encounter: Payer: Self-pay | Admitting: Nurse Practitioner

## 2014-06-17 ENCOUNTER — Other Ambulatory Visit: Payer: Self-pay | Admitting: Emergency Medicine

## 2014-06-24 ENCOUNTER — Encounter: Payer: Self-pay | Admitting: Emergency Medicine

## 2014-07-10 ENCOUNTER — Other Ambulatory Visit: Payer: Self-pay | Admitting: Family Medicine

## 2014-07-10 DIAGNOSIS — R1084 Generalized abdominal pain: Secondary | ICD-10-CM

## 2014-07-17 ENCOUNTER — Other Ambulatory Visit: Payer: 59

## 2014-08-15 ENCOUNTER — Ambulatory Visit
Admission: RE | Admit: 2014-08-15 | Discharge: 2014-08-15 | Disposition: A | Discharge status: Home / Self Care | Payer: 59 | Source: Ambulatory Visit | Attending: Family Medicine | Admitting: Family Medicine

## 2014-08-15 DIAGNOSIS — R1084 Generalized abdominal pain: Secondary | ICD-10-CM

## 2014-09-15 ENCOUNTER — Other Ambulatory Visit: Payer: Self-pay | Admitting: Internal Medicine

## 2014-10-22 ENCOUNTER — Telehealth: Payer: Self-pay | Admitting: *Deleted

## 2014-10-22 NOTE — Telephone Encounter (Signed)
LM for pt to call back and schedule AEX.

## 2014-10-22 NOTE — Telephone Encounter (Signed)
08 Pap recall due 08/2014 due to previous ASCUS with pos HR HPV  Past History:   08/10/13 Pap, ASCUS with neg HR HPV 06/23/12 Pap, negative 05/10/11 Pap, negative 05/05/10 Pap, ASCUS with neg HR HPV 11/12/09 Pap, ASCUS with neg HR HPV 05/15/09 Colpo, Negative 05/01/09, ASCUS with pos HR HPV  Pt has not scheduled AEX with Edman Circle, FNP.  Please call pt to schedule AEX.  Thank you.

## 2014-10-25 NOTE — Telephone Encounter (Signed)
LM for pt to call back to schedule AEX. Second attempt.

## 2014-11-06 NOTE — Telephone Encounter (Signed)
AEX scheduled for 01/02/15 with Edman Circle, FNP.  Recall date extended.   Routing to provider for final review.  Closing encounter.

## 2014-12-14 ENCOUNTER — Other Ambulatory Visit: Payer: Self-pay | Admitting: Internal Medicine

## 2014-12-14 ENCOUNTER — Other Ambulatory Visit: Payer: Self-pay | Admitting: Physician Assistant

## 2014-12-20 ENCOUNTER — Other Ambulatory Visit: Payer: Self-pay | Admitting: Nurse Practitioner

## 2014-12-23 NOTE — Telephone Encounter (Signed)
Medication refill request: Estradiol 0.5 mg/ Progesterone 100 mg Last AEX:  08/10/13 with PG  Next AEX: 01/02/15 with PG  Last MMG (if hormonal medication request): 08/08/14 Bi-Rads 2: Benign Refill authorized: #90 and #30 ? , please advise?

## 2015-01-02 ENCOUNTER — Ambulatory Visit (INDEPENDENT_AMBULATORY_CARE_PROVIDER_SITE_OTHER): Payer: 59 | Admitting: Nurse Practitioner

## 2015-01-02 ENCOUNTER — Encounter: Payer: Self-pay | Admitting: Nurse Practitioner

## 2015-01-02 VITALS — BP 136/80 | HR 80 | Ht 63.25 in | Wt 214.1 lb

## 2015-01-02 DIAGNOSIS — Z01419 Encounter for gynecological examination (general) (routine) without abnormal findings: Secondary | ICD-10-CM | POA: Diagnosis not present

## 2015-01-02 DIAGNOSIS — Z0000 Encounter for general adult medical examination without abnormal findings: Secondary | ICD-10-CM | POA: Diagnosis not present

## 2015-01-02 DIAGNOSIS — J069 Acute upper respiratory infection, unspecified: Secondary | ICD-10-CM

## 2015-01-02 LAB — POCT URINALYSIS DIPSTICK
Bilirubin, UA: NEGATIVE
Blood, UA: NEGATIVE
Glucose, UA: NEGATIVE
Ketones, UA: NEGATIVE
Leukocytes, UA: NEGATIVE
Nitrite, UA: NEGATIVE
Urobilinogen, UA: NEGATIVE
pH, UA: 5

## 2015-01-02 LAB — CBC WITH DIFFERENTIAL/PLATELET
Basophils Absolute: 0 10*3/uL (ref 0.0–0.1)
Basophils Relative: 0 % (ref 0–1)
Eosinophils Absolute: 0 10*3/uL (ref 0.0–0.7)
Eosinophils Relative: 0 % (ref 0–5)
HCT: 40.4 % (ref 36.0–46.0)
Hemoglobin: 13.7 g/dL (ref 12.0–15.0)
Lymphocytes Relative: 13 % (ref 12–46)
Lymphs Abs: 1.4 10*3/uL (ref 0.7–4.0)
MCH: 31.2 pg (ref 26.0–34.0)
MCHC: 33.9 g/dL (ref 30.0–36.0)
MCV: 92 fL (ref 78.0–100.0)
MPV: 9.5 fL (ref 8.6–12.4)
Monocytes Absolute: 0.6 10*3/uL (ref 0.1–1.0)
Monocytes Relative: 6 % (ref 3–12)
Neutro Abs: 8.7 10*3/uL — ABNORMAL HIGH (ref 1.7–7.7)
Neutrophils Relative %: 81 % — ABNORMAL HIGH (ref 43–77)
Platelets: 244 10*3/uL (ref 150–400)
RBC: 4.39 MIL/uL (ref 3.87–5.11)
RDW: 14.1 % (ref 11.5–15.5)
WBC: 10.7 10*3/uL — ABNORMAL HIGH (ref 4.0–10.5)

## 2015-01-02 MED ORDER — PROGESTERONE MICRONIZED 100 MG PO CAPS: ORAL_CAPSULE | ORAL | Status: DC

## 2015-01-02 MED ORDER — ESTRADIOL 0.5 MG PO TABS: ORAL_TABLET | ORAL | Status: DC

## 2015-01-02 NOTE — Patient Instructions (Signed)

## 2015-01-02 NOTE — Progress Notes (Signed)
54 y.o. [id number] Married  African American Fe here for annual exam.  Having increase in headaches, fatigue , malaise, maybe fever/ chills a few days ago.  Daughter was sick a few days.  She denies N/V, chest congestion, sore throat, diarrhea.  She does feel better being back on her HRT.  Still some vaso symptoms but tolerable.  Patient's last menstrual period was 08/09/2006.          Sexually active: Yes.    The current method of family planning is tubal ligation.    Exercising: Yes.    walking Smoker:  no  Health Maintenance: Pap:  08/10/13, ASCUS, neg HR HPV MMG:  08-08-2014 BI RADS 2: benign Colonoscopy:  2012 , Normal BMD:   12-11-2013 T Score 1.6 S/-1.0 R/0.2 L TDaP:  05/10/11 Labs: PCP does labs   reports that she quit smoking about 26 years ago. She has never used smokeless tobacco. She reports that she does not drink alcohol or use illicit drugs.  Past Medical History  Diagnosis Date  . Hypertension   . High cholesterol   . Obesity, unspecified   . Other abnormal glucose   . 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 Gout     Past Surgical History  Procedure Laterality Date  . Cosmetic surgery      breast reduction  . Breast surgery Bilateral 2007    reduction  . Colposcopy  108/10    negative, repeat pap normal  . Tubal ligation  1991    Current Outpatient Prescriptions  Medication Sig Dispense Refill  . allopurinol (ZYLOPRIM) 300 MG tablet Take 300 mg by mouth daily as needed.    Marland Kitchen estradiol (ESTRACE) 0.5 MG tablet TAKE 1 TABLET (0.5 MG TOTAL) BY MOUTH DAILY. 90 tablet 3  . losartan (COZAAR) 100 MG tablet Take 1 tablet by mouth  daily 90 tablet 0  . progesterone (PROMETRIUM) 100 MG capsule TAKE 1 CAPSULE (100 MG TOTAL) BY MOUTH DAILY. 30 capsule 3   No current facility-administered medications for this visit.    Family History  Problem Relation Age of Onset  . Hypertension Mother   . Cancer Mother     cervical  . Hyperlipidemia Mother   .  Hypertension Father   . Hyperlipidemia Father   . Diabetes Other   . Cancer Other 40    breast  . Hyperlipidemia Brother   . Hypertension Brother     ROS:  Pertinent items are noted in HPI.  Otherwise, a comprehensive ROS was negative.  Exam:   BP 136/80 mmHg  Pulse 80  Ht 5' 3.25" (1.607 m)  Wt 214 lb 2 oz (97.126 kg)  BMI 37.61 kg/m2  LMP 08/09/2006 Height: 5' 3.25" (160.7 cm) Ht Readings from Last 3 Encounters:  01/02/15 5' 3.25" (1.607 m)  05/16/14 5' 3.5" (1.613 m)  12/05/13 5' 3.5" (1.613 m)    General appearance: alert, cooperative and appears stated age Head: Normocephalic, without obvious abnormality, atraumatic Neck: no adenopathy, supple, symmetrical, trachea midline and thyroid normal to inspection and palpation Lungs: clear to auscultation bilaterally Breasts: normal appearance, no masses or tenderness, breast reduction scars Heart: regular rate and rhythm Abdomen: soft, non-tender; no masses,  no organomegaly Extremities: extremities normal, atraumatic, no cyanosis or edema Skin: Skin color, texture, turgor normal. No rashes or lesions Lymph nodes: Cervical, supraclavicular, and axillary nodes normal. No abnormal inguinal nodes palpated Neurologic: Grossly normal   Pelvic: External genitalia:  no lesions              Urethra:  normal appearing urethra with no masses, tenderness or lesions              Bartholin's and Skene's: normal                 Vagina: normal appearing vagina with normal color and discharge that is from vaginal insert for dryness that is removed prior to pap, no lesions              Cervix: anteverted              Pap taken: Yes.   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

## 2015-01-03 ENCOUNTER — Telehealth: Payer: Self-pay | Admitting: Emergency Medicine

## 2015-01-03 NOTE — Telephone Encounter (Signed)
Patient returned call. Advised of results per Patty's instruction. Patient states she feels terrible. Audible deep cough and voice sounds very hoarse. Instructed to call PCP and advised that is they are unable to see her before holiday weekend, need to see someone at urgent care. Patient agreeable.  Routing to provider for final review. Patient agreeable to disposition. Will close encounter.

## 2015-01-03 NOTE — Telephone Encounter (Signed)
Message left to return call to Ashley Boyle at [phone].    

## 2015-01-03 NOTE — Telephone Encounter (Signed)
-----   Message from Kem Boroughs, [place] sent at 01/03/2015  8:22 AM EDT ----- Please get a status update - she did not feel well yesterday.  URI? Sinusitis? Also let Rahniya know that CBC was only slightly elevated - if she still feels bad today have her see PCP.

## 2015-01-05 NOTE — Progress Notes (Signed)
Encounter reviewed by Dr. Brook Silva.  

## 2015-01-09 LAB — IPS PAP TEST WITH HPV

## 2015-01-16 ENCOUNTER — Telehealth: Payer: Self-pay | Admitting: Emergency Medicine

## 2015-01-16 NOTE — Telephone Encounter (Signed)
-----   Message from Kem Boroughs, [place] sent at 01/14/2015  5:44 PM EDT ----- Please call pt with her pap slight abnormal and negative HR HPV.  Discussed with Dr. Quincy Simmonds and will do co testing in 1 year.  (last year ASCUS with negative HPV - this year the opposite)  08

## 2015-01-22 NOTE — Telephone Encounter (Signed)
-----   Message from Michele Mcalpine, RN sent at 01/21/2015  3:29 PM EDT -----   ----- Message -----    From: Michele Mcalpine, RN    Sent: 01/21/2015   3:28 PM      To: Kem Boroughs, FNP    ----- Message -----    From: Kem Boroughs, FNP    Sent: 01/14/2015   5:44 PM      To: Trellis Paganini Fast, RN  Please call pt with her pap slight abnormal and negative HR HPV.  Discussed with Dr. Quincy Simmonds and will do co testing in 1 year.  (last year ASCUS with negative HPV - this year the opposite)  08

## 2015-01-22 NOTE — Telephone Encounter (Signed)
Called patient with normal pap results. Advised will not receive letter as results were ASCUS but normal pap smear and negative HPV testing. Advised provider will repeat pap smear next year. Patient agreeable. Has annual exam scheduled with provider. 08 recall in place.   Routing to provider for final review. Patient agreeable to disposition. Will close encounter.

## 2015-02-28 ENCOUNTER — Other Ambulatory Visit: Payer: 59

## 2015-02-28 ENCOUNTER — Other Ambulatory Visit: Payer: Self-pay | Admitting: Family Medicine

## 2015-02-28 DIAGNOSIS — M79604 Pain in right leg: Secondary | ICD-10-CM

## 2016-01-16 ENCOUNTER — Ambulatory Visit: Payer: 59 | Admitting: Nurse Practitioner

## 2016-02-23 ENCOUNTER — Telehealth: Payer: Self-pay | Admitting: *Deleted

## 2016-02-23 NOTE — Telephone Encounter (Signed)
Patient in 08 recall for 01/2016. Please contact patient and schedule for AEX/PAP. Thanks Margaretha Sheffield

## 2016-02-26 NOTE — Telephone Encounter (Signed)
Message left to return call to Burton at [phone].  RE:  Scheduling recall AEX

## 2016-03-03 NOTE — Telephone Encounter (Signed)
Left message for patient to call back and schedule AEX/Pap. -sco

## 2016-03-04 NOTE — Telephone Encounter (Signed)
Patient has not returned call regarding 08 recall.  Please advise on recall status/letter Thanks Margaretha Sheffield

## 2016-03-05 NOTE — Telephone Encounter (Signed)
Send letter and remove from recall.

## 2016-03-08 ENCOUNTER — Encounter: Payer: Self-pay | Admitting: *Deleted

## 2016-03-08 NOTE — Telephone Encounter (Signed)
Letter sent - removed from recall -eh 

## 2016-04-23 ENCOUNTER — Encounter: Payer: Self-pay | Admitting: Nurse Practitioner

## 2016-04-23 ENCOUNTER — Ambulatory Visit: Payer: 59 | Admitting: Nurse Practitioner

## 2016-04-23 ENCOUNTER — Ambulatory Visit (INDEPENDENT_AMBULATORY_CARE_PROVIDER_SITE_OTHER): Payer: 59 | Admitting: Nurse Practitioner

## 2016-04-23 VITALS — BP 124/82 | HR 68 | Ht 63.0 in | Wt 219.0 lb

## 2016-04-23 DIAGNOSIS — N951 Menopausal and female climacteric states: Secondary | ICD-10-CM

## 2016-04-23 DIAGNOSIS — Z0000 Encounter for general adult medical examination without abnormal findings: Secondary | ICD-10-CM | POA: Diagnosis not present

## 2016-04-23 DIAGNOSIS — Z1211 Encounter for screening for malignant neoplasm of colon: Secondary | ICD-10-CM | POA: Diagnosis not present

## 2016-04-23 DIAGNOSIS — Z01419 Encounter for gynecological examination (general) (routine) without abnormal findings: Secondary | ICD-10-CM | POA: Diagnosis not present

## 2016-04-23 DIAGNOSIS — R7309 Other abnormal glucose: Secondary | ICD-10-CM | POA: Diagnosis not present

## 2016-04-23 DIAGNOSIS — R81 Glycosuria: Secondary | ICD-10-CM | POA: Diagnosis not present

## 2016-04-23 NOTE — Patient Instructions (Addendum)

## 2016-04-23 NOTE — Progress Notes (Addendum)
Patient ID: Ashley Boyle, female   DOB: Apr 27, 1961, 55 y.o.   MRN: [id number]  55 y.o. G2P2002 Married  African American Fe here for annual exam.  No new diagnosis.  She is off HRT.  Now on OTC [place]be[place] that she got [place] with some help with vaso symptoms.  Patient's last menstrual period was 08/09/2006.          Sexually active: Yes.    The current method of family planning is tubal ligation.    Exercising: Yes.    The patient has a physically strenuous job, but has no regular exercise apart from work.  Smoker:  no  Health Maintenance: Pap: 01/02/15, ASCUS with neg HR HPV MMG: 08/14/15, Bi-Rads 1: Negative Colonoscopy:  08/12/2010, normal, repeat 10 years BMD:  06/21/13 T Score, 1.6 Spine / -1.0 Right Femur Neck / -0.2 Left Femur Neck TDaP:  05/10/11 Hep C and HIV: done today Labs: PCP takes care of all labs  Urine 2 + glucose all else negative   reports that she quit smoking about 27 years ago. She has a 0.50 pack-year smoking history. She has never used smokeless tobacco. She reports that she does not drink alcohol or use drugs.  Past Medical History:  Diagnosis Date  . Gout   . High cholesterol   . Hypertension   . Obesity, unspecified   . Other abnormal glucose   . 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 BREAST SURGERY Bilateral 2007   reduction  . COLPOSCOPY  108/10   negative, repeat pap normal  . COSMETIC SURGERY     breast reduction  . TUBAL LIGATION  1991    Current Outpatient Prescriptions  Medication Sig Dispense Refill  . allopurinol (ZYLOPRIM) 300 MG tablet Take 300 mg by mouth daily as needed.    Marland Kitchen estradiol (ESTRACE) 0.5 MG tablet TAKE 1 TABLET (0.5 MG TOTAL) BY MOUTH DAILY. 90 tablet 3  . losartan (COZAAR) 100 MG tablet Take 1 tablet by mouth  daily 90 tablet 0  . progesterone (PROMETRIUM) 100 MG capsule TAKE 1 CAPSULE (100 MG TOTAL) BY MOUTH DAILY. 30 capsule 3   No current  facility-administered medications for this visit.     Family History  Problem Relation Age of Onset  . Hypertension Mother   . Cancer Mother     cervical  . Hyperlipidemia Mother   . Hypertension Father   . Hyperlipidemia Father   . Diabetes Other   . Cancer Other 40    breast  . Hyperlipidemia Brother   . Hypertension Brother     ROS:  Pertinent items are noted in HPI.  Otherwise, a comprehensive ROS was negative.  Exam:   LMP 08/09/2006    Ht Readings from Last 3 Encounters:  01/02/15 5' 3.25" (1.607 m)  05/16/14 5' 3.5" (1.613 m)  12/05/13 5' 3.5" (1.613 m)    General appearance: alert, cooperative and appears stated age Head: Normocephalic, without obvious abnormality, atraumatic Neck: no adenopathy, supple, symmetrical, trachea midline and thyroid normal to inspection and palpation Lungs: clear to auscultation bilaterally Breasts: normal appearance, no masses or tenderness Heart: regular rate and rhythm Abdomen: soft, non-tender; no masses,  no organomegaly Extremities: extremities normal, atraumatic, no cyanosis or edema Skin: Skin color, texture, turgor normal. No rashes or lesions Lymph nodes: Cervical, supraclavicular, and axillary nodes normal. No abnormal inguinal nodes palpated Neurologic: Grossly normal  Pelvic: External genitalia:  no lesions              Urethra:  normal appearing urethra with no masses, tenderness or lesions              Bartholin's and Skene's: normal                 Vagina: normal appearing vagina with normal color and discharge, no lesions              Cervix: anteverted              Pap taken: Yes.   Bimanual Exam:  Uterus:  normal size, contour, position, consistency, mobility, non-tender              Adnexa: no mass, fullness, tenderness               Rectovaginal: Confirms               Anus:  normal sphincter tone, no lesions  Chaperone present: yes  A:  Well Woman with normal exam  History of early menopause and HRT  09/2007 to present History of abnormal pap 2010 with HPV, colpo biopsy normal              2015 & 2016 PAP - ASCUS with negative HR HPV History of HTN, per pt history of renal disease per PCP.  Glycosuria   P:   Reviewed health and wellness pertinent to exam  Pap smear as above  Mammogram is due 08/2016  Follow with labs and IFOB  Counseled on breast self exam, mammography screening, adequate intake of calcium and vitamin D, diet and exercise return annually or prn  An After Visit Summary was printed and given to the patient.

## 2016-04-24 LAB — HIV ANTIBODY (ROUTINE TESTING W REFLEX): HIV 1&2 Ab, 4th Generation: NONREACTIVE

## 2016-04-24 LAB — HEMOGLOBIN A1C
Hgb A1c MFr Bld: 5.6 % (ref <5.7)
Mean Plasma Glucose: 114 mg/dL

## 2016-04-24 LAB — HEPATITIS C ANTIBODY: HCV Ab: NEGATIVE

## 2016-04-25 NOTE — Progress Notes (Signed)
Encounter reviewed by Dr. Brook Amundson C. Silva.  

## 2016-04-28 LAB — IPS PAP TEST WITH HPV

## 2016-04-29 ENCOUNTER — Telehealth: Payer: Self-pay | Admitting: *Deleted

## 2016-04-29 NOTE — Telephone Encounter (Signed)
-----   Message from Nunzio Cobbs, MD sent at 04/28/2016  1:07 PM EDT ----- Please inform patient of negative pap and negative HR HPV.  Recall - 02.  Next pap in 3 years.

## 2016-04-29 NOTE — Telephone Encounter (Signed)
I have attempted to contact this patient by phone with the following results: left message to return call to [place] at [phone] on answering machine (mobile per [facility]).  Advised call was regarding normal pap results and follow up. [phone] (Mobile) *Preferred*

## 2016-05-03 NOTE — Telephone Encounter (Signed)
I have attempted to contact this patient by phone with the following results: left message to return call to [place] at [phone] on answering machine (mobile per [facility]). Advised call was regarding normal pap results and follow up. [phone] (Mobile) *Preferred*

## 2016-05-10 NOTE — Telephone Encounter (Signed)
Pt notified in result note.  Closing encounter. 

## 2016-07-15 ENCOUNTER — Encounter: Payer: Self-pay | Admitting: *Deleted

## 2016-08-14 IMAGING — US US ABDOMEN COMPLETE
1 series · 13 of 25 positions shown · non-contrast
Comparison: 07/08/2005 renal sonogram.

CLINICAL DATA: 53-year-old hypertensive female with high
cholesterol and chronic renal disease presenting with abdominal pain
and vomiting for the past 3 weeks. Initial encounter.

EXAM:
ULTRASOUND ABDOMEN COMPLETE

[Series 1: us abdomen complete · 0.26mm/px · 13 of 81 slices shown]
[im 1/81]
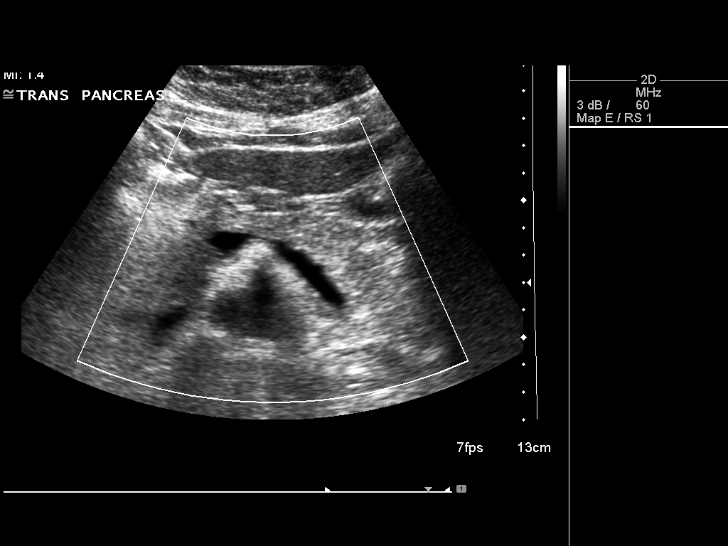
[im 7/81]
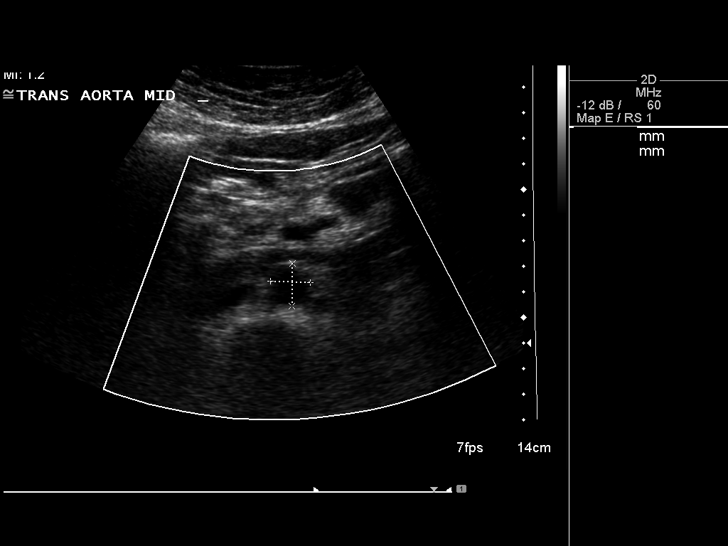
[im 14/81]
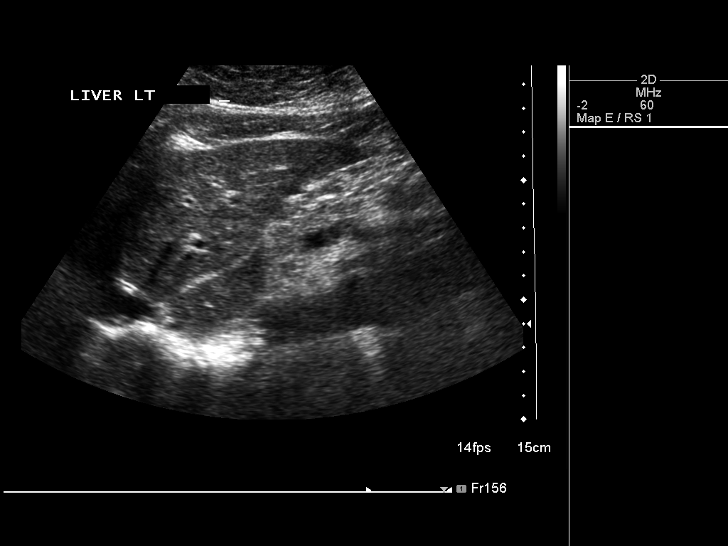
[im 21/81]
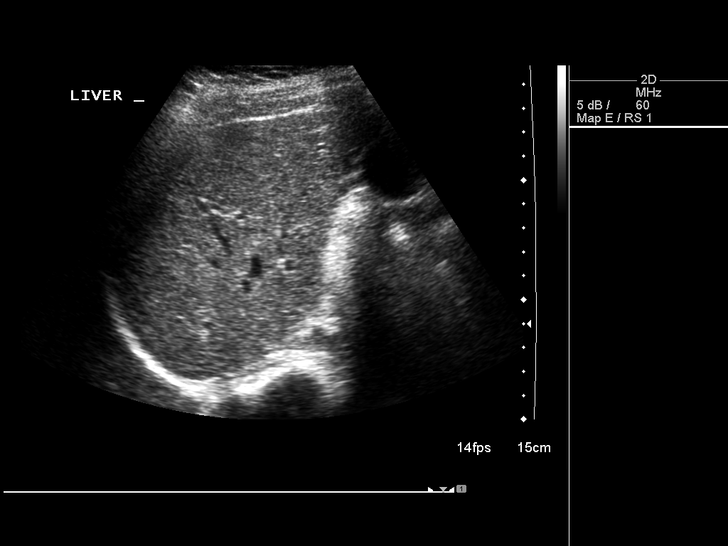
[im 27/81]
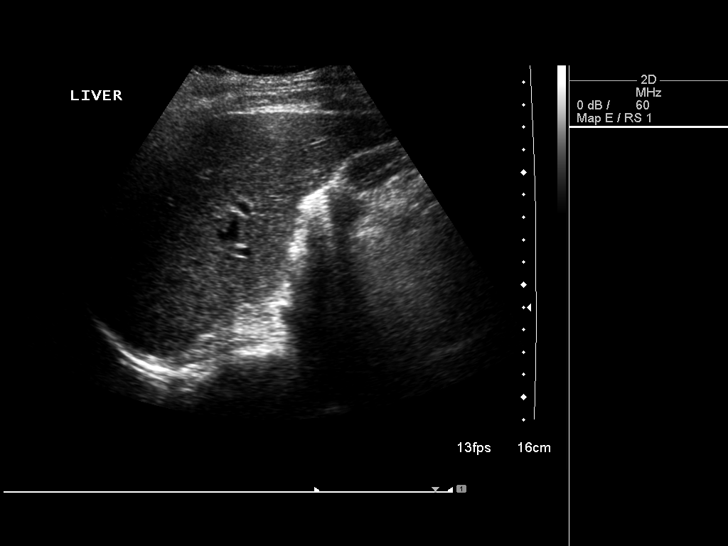
[im 34/81]
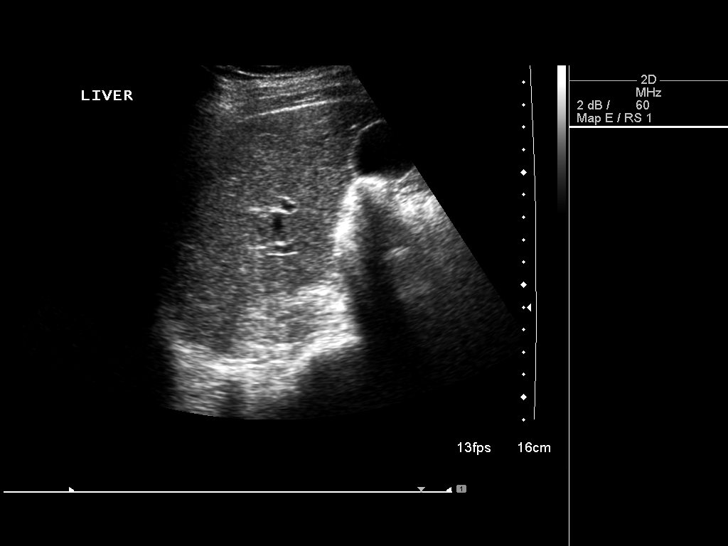
[im 41/81]
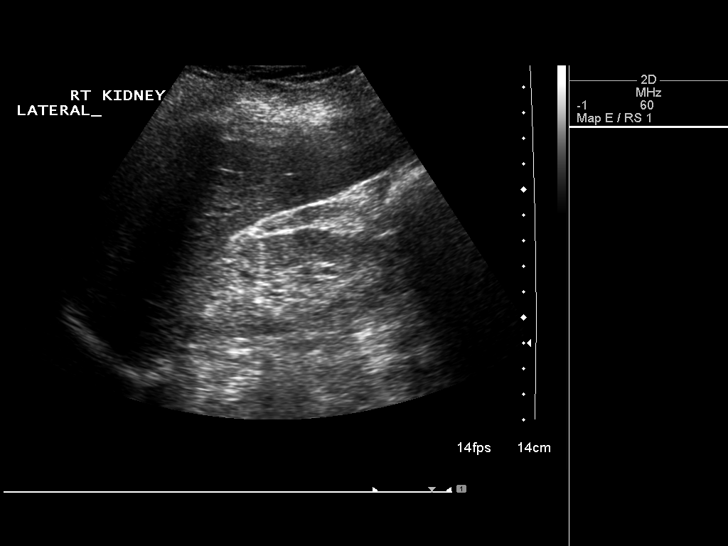
[im 47/81]
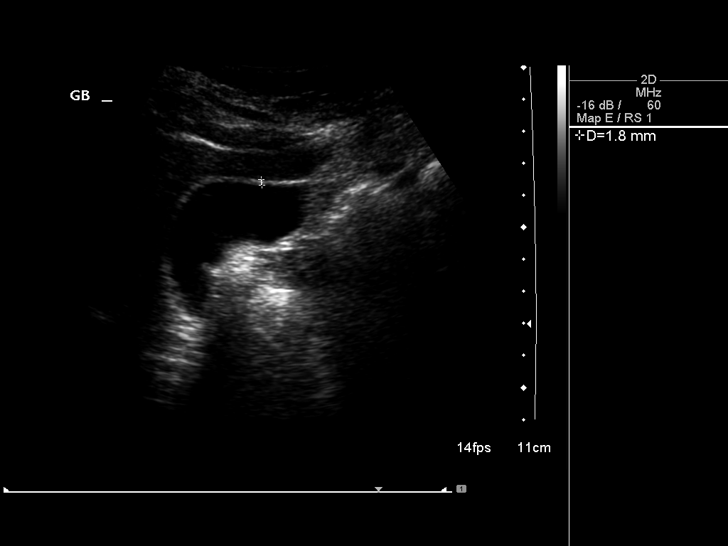
[im 54/81]
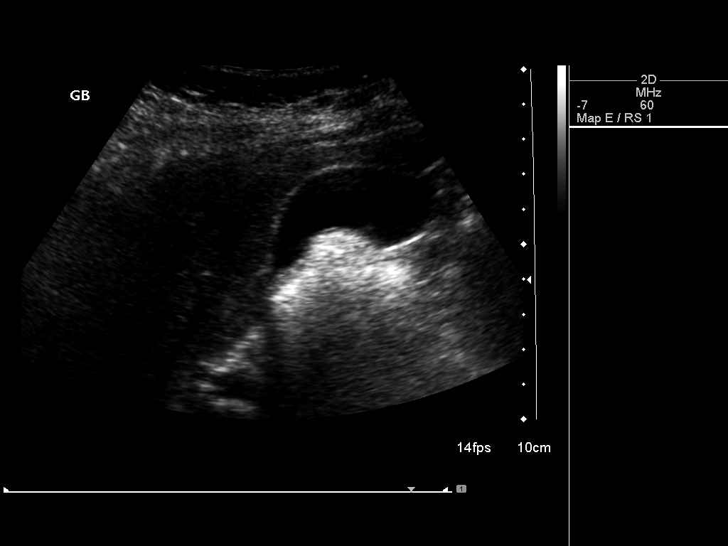
[im 61/81]
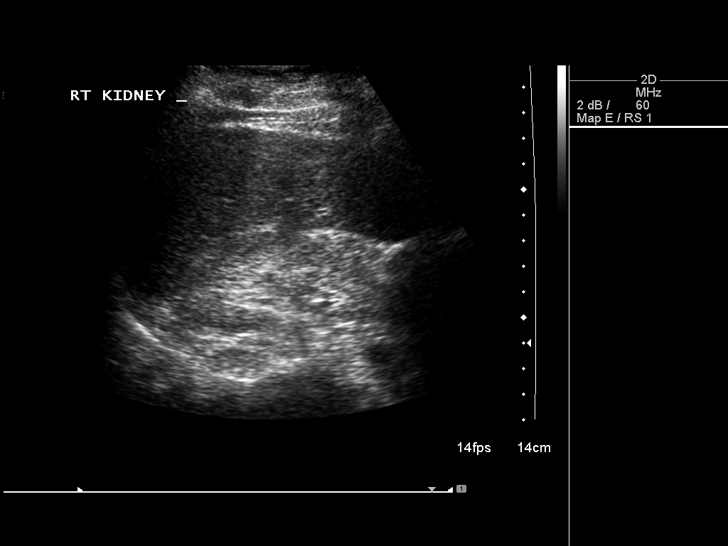
[im 67/81]
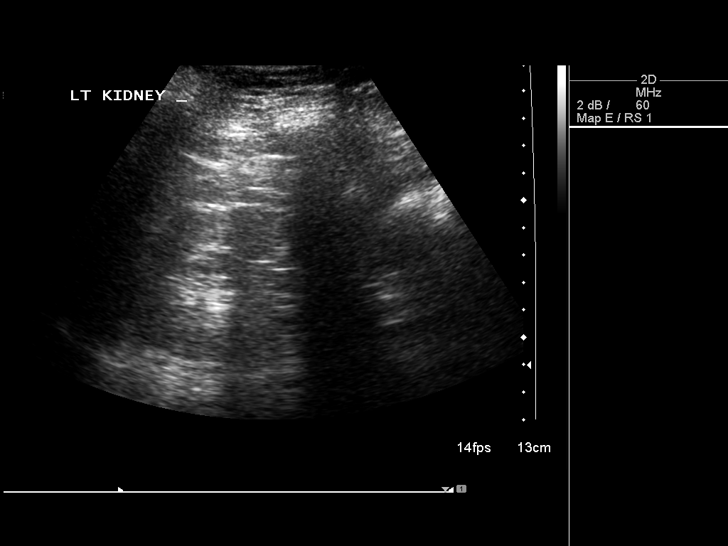
[im 74/81]
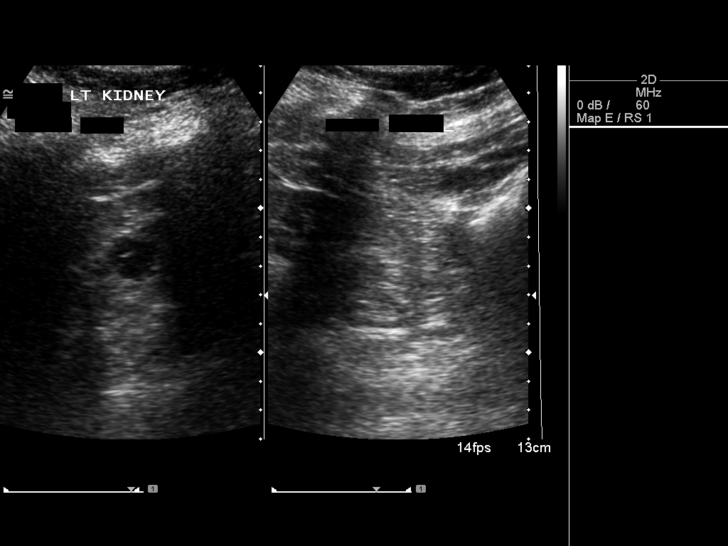
[im 81/81]
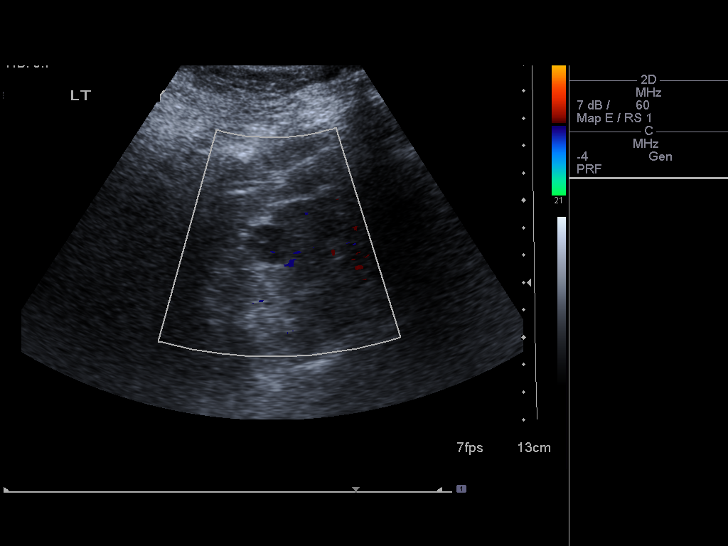

[13 of 25 positions shown; findings below may reference images not displayed]

FINDINGS: Gallbladder: No gallstones or wall thickening visualized. No
sonographic Murphy sign noted.

Common bile duct: Diameter: 3.3 mm.

Liver: No focal lesion identified. Within normal limits in
parenchymal echogenicity.

IVC: No abnormality visualized.

Pancreas: Visualized portion unremarkable.

Spleen: Size and appearance within normal limits.

Right Kidney: Length: 10.1 cm.. Increased echogenicity suggestive of
result of medical renal disease. No hydronephrosis or renal mass.

Left Kidney: Length: 9.2 cm.. Increased echogenicity suggestive of
result of medical renal disease. No hydronephrosis. Upper pole cyst
measures up to 1.9 cm and is difficult adequately assessed second
patient's habitus. There may be a small septation. Previously, cyst
at this level measures 1.5 cm.

Abdominal aorta: Distal aspect not visualized secondary to bowel
gas. Maximal transverse dimension obtained of 1.8 cm.

Other findings: None.
IMPRESSION: Increased echogenicity of renal parenchyma bilaterally suggestive of
result of medical renal disease type changes.

Left upper pole cyst measures up to 1.9 cm and may contain a small
septation. Evaluation limited secondary to habitus and bowel gas.
Previously this measured 1.5 cm.

Examination portions of the pancreas, aorta and inferior vena cava
limited secondary to bowel gas and habitus.

No gallstones noted.

## 2016-09-20 ENCOUNTER — Telehealth: Payer: Self-pay | Admitting: Nurse Practitioner

## 2016-09-20 NOTE — Telephone Encounter (Signed)
Please let pt know that BMD done on 09/09/16 shows a T Score at the spine of -0.60; right hip neck at -1.30; left hip neck at -0.80.   In comparison to previous report of 06/21/13 there has been a decrease at the spine, but an increase in both hips.  The lowest value was at the left hip which is in the low normal range.  But we do not want to see further loss.  Congratulations on walking (usually associated with her work) which has improved the hips but needs to add some upper body weights at lease 3 times a week. She may do this at home with hand weights.  Continue calcium and Vit D support.

## 2016-09-21 NOTE — Telephone Encounter (Signed)
Patient advised of BMD results. Patient verbalized agreement and understanding.

## 2016-09-27 ENCOUNTER — Other Ambulatory Visit: Payer: Self-pay | Admitting: Radiology

## 2017-04-25 ENCOUNTER — Ambulatory Visit: Payer: 59 | Admitting: Nurse Practitioner

## 2017-04-26 ENCOUNTER — Ambulatory Visit: Payer: 59 | Admitting: Certified Nurse Midwife

## 2017-04-26 ENCOUNTER — Encounter: Payer: Self-pay | Admitting: Certified Nurse Midwife

## 2017-04-26 ENCOUNTER — Ambulatory Visit (INDEPENDENT_AMBULATORY_CARE_PROVIDER_SITE_OTHER): Payer: 59 | Admitting: Certified Nurse Midwife

## 2017-04-26 ENCOUNTER — Other Ambulatory Visit (HOSPITAL_COMMUNITY)
Admission: RE | Admit: 2017-04-26 | Discharge: 2017-04-26 | Disposition: A | Discharge status: Home / Self Care | Payer: 59 | Source: Ambulatory Visit | Attending: Obstetrics & Gynecology | Admitting: Obstetrics & Gynecology

## 2017-04-26 VITALS — BP 122/82 | HR 70 | Resp 16 | Ht 63.0 in | Wt 212.0 lb

## 2017-04-26 DIAGNOSIS — Z124 Encounter for screening for malignant neoplasm of cervix: Secondary | ICD-10-CM | POA: Diagnosis not present

## 2017-04-26 DIAGNOSIS — Z01419 Encounter for gynecological examination (general) (routine) without abnormal findings: Secondary | ICD-10-CM

## 2017-04-26 DIAGNOSIS — N951 Menopausal and female climacteric states: Secondary | ICD-10-CM | POA: Diagnosis not present

## 2017-04-26 NOTE — Patient Instructions (Signed)
EXERCISE AND DIET:  We recommended that you start or continue a regular exercise program for good health. Regular exercise means any activity that makes your heart beat faster and makes you sweat.  We recommend exercising at least 30 minutes per day at least 3 days a week, preferably 4 or 5.  We also recommend a diet low in fat and sugar.  Inactivity, poor dietary choices and obesity can cause diabetes, heart attack, stroke, and kidney damage, among others.    ALCOHOL AND SMOKING:  Women should limit their alcohol intake to no more than 7 drinks/beers/glasses of wine (combined, not each!) per week. Moderation of alcohol intake to this level decreases your risk of breast cancer and liver damage. And of course, no recreational drugs are part of a healthy lifestyle.  And absolutely no smoking or even second hand smoke. Most people know smoking can cause heart and lung diseases, but did you know it also contributes to weakening of your bones? Aging of your skin?  Yellowing of your teeth and nails?  CALCIUM AND VITAMIN D:  Adequate intake of calcium and Vitamin D are recommended.  The recommendations for exact amounts of these supplements seem to change often, but generally speaking 600 mg of calcium (either carbonate or citrate) and 800 units of Vitamin D per day seems prudent. Certain women may benefit from higher intake of Vitamin D.  If you are among these women, your doctor will have told you during your visit.    PAP SMEARS:  Pap smears, to check for cervical cancer or precancers,  have traditionally been done yearly, although recent scientific advances have shown that most women can have pap smears less often.  However, every woman still should have a physical exam from her gynecologist every year. It will include a breast check, inspection of the vulva and vagina to check for abnormal growths or skin changes, a visual exam of the cervix, and then an exam to evaluate the size and shape of the uterus and  ovaries.  And after 57 years of age, a rectal exam is indicated to check for rectal cancers. We will also provide age appropriate advice regarding health maintenance, like when you should have certain vaccines, screening for sexually transmitted diseases, bone density testing, colonoscopy, mammograms, etc.   MAMMOGRAMS:  All women over 40 years old should have a yearly mammogram. Many facilities now offer a "3D" mammogram, which may cost around $50 extra out of pocket. If possible,  we recommend you accept the option to have the 3D mammogram performed.  It both reduces the number of women who will be called back for extra views which then turn out to be normal, and it is better than the routine mammogram at detecting truly abnormal areas.    COLONOSCOPY:  Colonoscopy to screen for colon cancer is recommended for all women at age 50.  We know, you hate the idea of the prep.  We agree, BUT, having colon cancer and not knowing it is worse!!  Colon cancer so often starts as a polyp that can be seen and removed at colonscopy, which can quite literally save your life!  And if your first colonoscopy is normal and you have no family history of colon cancer, most women don't have to have it again for 10 years.  Once every ten years, you can do something that may end up saving your life, right?  We will be happy to help you get it scheduled when you are ready.    Be sure to check your insurance coverage so you understand how much it will cost.  It may be covered as a preventative service at no cost, but you should check your particular policy.      Atrophic Vaginitis Atrophic vaginitis is when the tissues that line the vagina become dry and thin. This is caused by a drop in estrogen. Estrogen helps:  To keep the vagina moist.  To make a clear fluid that helps: ? To lubricate the vagina for sex. ? To protect the vagina from infection.  If the lining of the vagina is dry and thin, it may:  Make sex painful. It  may also cause bleeding.  Cause a feeling of: ? Burning. ? Irritation. ? Itchiness.  Make an exam of your vagina painful. It may also cause bleeding.  Make you lose interest in sex.  Cause a burning feeling when you pee.  Make your vaginal fluid (discharge) brown or yellow.  For some women, there are no symptoms. This condition is most common in women who do not get their regular menstrual periods anymore (menopause). This often starts when a woman is 77-52 years old. Follow these instructions at home:  Take medicines only as told by your doctor. Do not use any herbal or alternative medicines unless your doctor says it is okay.  Use over-the-counter products for dryness only as told by your doctor. These include: ? Creams. ? Lubricants. ? Moisturizers.  Do not douche.  Do not use products that can make your vagina dry. These include: ? Scented feminine sprays. ? Scented tampons. ? Scented soaps.  If it hurts to have sex, tell your sexual partner. Contact a doctor if:  Your discharge looks different than normal.  Your vagina has an unusual smell.  You have new symptoms.  Your symptoms do not get better with treatment.  Your symptoms get worse. This information is not intended to replace advice given to you by your health care provider. Make sure you discuss any questions you have with your health care provider. Document Released: 01/12/2008 Document Revised: 01/01/2016 Document Reviewed: 07/17/2014 Elsevier Interactive Patient Education  [employer].

## 2017-04-26 NOTE — Progress Notes (Signed)
56 y.o. Ashley Boyle Married  African American Fe here for annual exam. Menopausal no HRT. Denies vaginal bleeding or some vaginal dryness, using Replens with some relief. Has been off HRT for two years.  Sees PCP Dr. Ernie Hew every 3 months for HTN, kidney, gout  Management, labs and aex. All stable per patient. Weight loss in past year, trying to stay in shape. Works at home, uses treadmill. No other health issues today.  Patient's last menstrual period was 08/09/2006.          Sexually active: Yes.    The current method of family planning is tubal ligation.    Exercising: No.  exercise Smoker:  no  Health Maintenance: Pap:  01-02-15 ASCUS HPV HR neg, 04-23-16 neg HPV HR neg History of Abnormal Pap: yes MMG:  2018 pt breast biopsy also, pt to sign for records Self Breast exams: yes Colonoscopy:  2012 f/u 35yrs BMD:   2018 f/u 40yrs low bone mass TDaP:  2018 Shingles: had done Pneumonia: had done Hep C and HIV: both neg 2017 Labs: if needed.   reports that she quit smoking about 28 years ago. She has a 0.50 pack-year smoking history. She has never used smokeless tobacco. She reports that she does not drink alcohol or use drugs.  Past Medical History:  Diagnosis Date  . Gout   . High cholesterol   . Hypertension   . Obesity, unspecified   . Other abnormal glucose   . 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 BREAST SURGERY Bilateral 2007   reduction  . COLPOSCOPY  108/10   negative, repeat pap normal  . COSMETIC SURGERY     breast reduction  . TUBAL LIGATION  1991    Current Outpatient Prescriptions  Medication Sig Dispense Refill  . allopurinol (ZYLOPRIM) 300 MG tablet Take 300 mg by mouth daily as needed.     No current facility-administered medications for this visit.     Family History  Problem Relation Age of Onset  . Hypertension Mother   . Cancer Mother        cervical  . Hyperlipidemia Mother    . Hypertension Father   . Hyperlipidemia Father   . Diabetes Other   . Cancer Other 40       breast  . Hyperlipidemia Brother   . Hypertension Brother   . Heart failure Maternal Aunt   . Hyperlipidemia Maternal Grandmother   . Hypertension Maternal Grandmother   . Diabetes Maternal Grandmother   . Hyperlipidemia Maternal Grandfather   . Hypertension Maternal Grandfather   . Hyperlipidemia Paternal Grandmother   . Hypertension Paternal Grandmother   . Hyperlipidemia Paternal Grandfather   . Hypertension Paternal Grandfather     ROS:  Pertinent items are noted in HPI.  Otherwise, a comprehensive ROS was negative.  Exam:   LMP 08/09/2006    Ht Readings from Last 3 Encounters:  04/23/16 5\' 3"  (1.6 m)  01/02/15 5' 3.25" (1.607 m)  05/16/14 5' 3.5" (1.613 m)    General appearance: alert, cooperative and appears stated age Head: Normocephalic, without obvious abnormality, atraumatic Neck: no adenopathy, supple, symmetrical, trachea midline and thyroid normal to inspection and palpation Lungs: clear to auscultation bilaterally Breasts: normal appearance, no masses or tenderness, No nipple retraction or dimpling, No nipple discharge or bleeding, No axillary or supraclavicular adenopathy Heart: regular rate and rhythm Abdomen: soft, non-tender; no  masses,  no organomegaly Extremities: extremities normal, atraumatic, no cyanosis or edema Skin: Skin color, texture, turgor normal. No rashes or lesions Lymph nodes: Cervical, supraclavicular, and axillary nodes normal. No abnormal inguinal nodes palpated Neurologic: Grossly normal   Pelvic: External genitalia:  no lesions              Urethra:  normal appearing urethra with no masses, tenderness or lesions              Bartholin's and Skene's: normal                 Vagina: normal appearing vagina with normal color and discharge, no lesions              Cervix: multiparous appearance, no cervical motion tenderness and no lesions               Pap taken: Yes.   Bimanual Exam:  Uterus:  normal size, contour, position, consistency, mobility, non-tender              Adnexa: normal adnexa and no mass, fullness, tenderness               Rectovaginal: Confirms               Anus:  normal sphincter tone, no lesions  Chaperone present: yes  A:  Well Woman with normal exam  Menopausal no HRT  History of ASCUS pap negative HPVHR  Vaginal dryness  Hypertension and gout management with PCP  P:   Reviewed health and wellness pertinent to exam  Discussed need to advise if vaginal bleeding  Discussed finding and option for treatment of dryness with Estrogen products or OTC option. Patient would like to try coconut oil and if no change will advise. Instructions given.  Continue follow up with PCP as indicated..  Pap smear: yes   counseled on breast self exam, mammography screening, adequate intake of calcium and vitamin D, diet and exercise, Kegel's exercises  return annually or prn  An After Visit Summary was printed and given to the patient.

## 2017-04-28 ENCOUNTER — Ambulatory Visit: Payer: 59 | Admitting: Certified Nurse Midwife

## 2017-04-28 LAB — CYTOLOGY - PAP: Diagnosis: NEGATIVE

## 2017-05-05 ENCOUNTER — Ambulatory Visit: Payer: 59 | Admitting: Certified Nurse Midwife

## 2017-05-16 ENCOUNTER — Encounter: Payer: Self-pay | Admitting: Obstetrics and Gynecology

## 2017-11-01 ENCOUNTER — Telehealth: Payer: Self-pay | Admitting: *Deleted

## 2017-11-01 NOTE — Telephone Encounter (Signed)
Patient in 04 recall for 10/2017. Patient needs follow up bilateral breast imaging. Please contact patient regarding scheduling

## 2017-11-02 NOTE — Telephone Encounter (Signed)
Pt states the breast imaging center is waiting on her order from her pcp. She said they are sending an order over for her & she will schedule it.

## 2017-11-21 NOTE — Telephone Encounter (Signed)
Left message for patient to return call regarding scheduling her follow up breast imagining -eh

## 2017-12-06 NOTE — Telephone Encounter (Signed)
Patient had Mammogram done 11-23-17. -eh

## 2018-05-05 ENCOUNTER — Ambulatory Visit: Payer: 59 | Admitting: Certified Nurse Midwife

## 2018-05-12 ENCOUNTER — Ambulatory Visit (INDEPENDENT_AMBULATORY_CARE_PROVIDER_SITE_OTHER): Payer: 59 | Admitting: Psychology

## 2018-05-12 DIAGNOSIS — F4323 Adjustment disorder with mixed anxiety and depressed mood: Secondary | ICD-10-CM

## 2018-05-25 ENCOUNTER — Ambulatory Visit: Payer: 59 | Admitting: Certified Nurse Midwife

## 2018-06-02 ENCOUNTER — Encounter: Payer: Self-pay | Admitting: Certified Nurse Midwife

## 2018-06-02 ENCOUNTER — Other Ambulatory Visit: Payer: Self-pay

## 2018-06-02 ENCOUNTER — Ambulatory Visit (INDEPENDENT_AMBULATORY_CARE_PROVIDER_SITE_OTHER): Payer: 59 | Admitting: Certified Nurse Midwife

## 2018-06-02 ENCOUNTER — Other Ambulatory Visit (HOSPITAL_COMMUNITY)
Admission: RE | Admit: 2018-06-02 | Discharge: 2018-06-02 | Disposition: A | Discharge status: Home / Self Care | Payer: 59 | Source: Ambulatory Visit | Attending: Obstetrics & Gynecology | Admitting: Obstetrics & Gynecology

## 2018-06-02 VITALS — BP 120/80 | HR 70 | Resp 16 | Ht 62.75 in | Wt 199.0 lb

## 2018-06-02 DIAGNOSIS — L900 Lichen sclerosus et atrophicus: Secondary | ICD-10-CM | POA: Diagnosis not present

## 2018-06-02 DIAGNOSIS — Z124 Encounter for screening for malignant neoplasm of cervix: Secondary | ICD-10-CM | POA: Diagnosis not present

## 2018-06-02 DIAGNOSIS — E663 Overweight: Secondary | ICD-10-CM

## 2018-06-02 DIAGNOSIS — Z01411 Encounter for gynecological examination (general) (routine) with abnormal findings: Secondary | ICD-10-CM | POA: Diagnosis not present

## 2018-06-02 DIAGNOSIS — A6009 Herpesviral infection of other urogenital tract: Secondary | ICD-10-CM

## 2018-06-02 MED ORDER — VALACYCLOVIR HCL 500 MG PO TABS: ORAL_TABLET | ORAL | 12 refills | Status: DC

## 2018-06-02 MED ORDER — CLOBETASOL PROPIONATE 0.05 % EX OINT: TOPICAL_OINTMENT | CUTANEOUS | 0 refills | Status: DC

## 2018-06-02 NOTE — Progress Notes (Signed)
57 y.o. [id number] Married  African American Fe here for annual exam. Menopausal no HRT. Denies vaginal bleeding. Some painful intercourse does not feel dry, not sure what the issue is. Sees PCP Dr. Ernie Hew every 2- 3 months, due to BP changes. Medication stable.. 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 Working on weight change and down 13 pounds! Walking daily to help with weight loss. No other health issues today.  Patient's last menstrual period was 08/09/2006.          Sexually active: yes   The current method of family planning is tubal ligation.    Exercising: Yes.    walking Smoker:  no  Review of Systems  Constitutional: Negative.   HENT: Negative.   Eyes: Negative.   Respiratory: Negative.   Cardiovascular: Negative.   Gastrointestinal: Negative.   Genitourinary: Negative.   Musculoskeletal: Negative.   Skin: Negative.   Neurological: Negative.   Endo/Heme/Allergies: Negative.   Psychiatric/Behavioral: Negative.     Health Maintenance: Pap:  04-23-16 neg HPV HR neg, 04-26-17 neg History of Abnormal Pap: yes MMG:  11-22-17 category a density birads 2:neg Self Breast exams: yes Colonoscopy:  2012 f/u 22yrs BMD:   2018 f/u 15yrs low bone mass TDaP:  2018 Shingles: had done Pneumonia: had done Hep C and HIV: both neg 2017 Labs: if needed   reports that she quit smoking about 29 years ago. She has a 0.50 pack-year smoking history. She has never used smokeless tobacco. She reports that she does not drink alcohol or use drugs.  Past Medical History:  Diagnosis Date  . Abnormal Pap smear of cervix    in the past  . Gout   . High cholesterol   . Hypertension   . Obesity, unspecified   . Other abnormal glucose   . 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 BREAST SURGERY Bilateral 2007   reduction  . COLPOSCOPY  108/10   negative, repeat pap normal  .  COSMETIC SURGERY     breast reduction  . TUBAL LIGATION  1991    Current Outpatient Medications  Medication Sig Dispense Refill  . Nebivolol HCl (BYSTOLIC) 20 MG TABS Take by mouth.    . Nutritional Supplements (ESTROVEN PO) Take by mouth.    . rosuvastatin (CRESTOR) 40 MG tablet      No current facility-administered medications for this visit.     Family History  Problem Relation Age of Onset  . Hypertension Mother   . Cancer Mother        cervical  . Hyperlipidemia Mother   . Hypertension Father   . Hyperlipidemia Father   . Diabetes Other   . Cancer Other 40       breast  . Hyperlipidemia Brother   . Hypertension Brother   . Heart failure Maternal Aunt   . Hyperlipidemia Maternal Grandmother   . Hypertension Maternal Grandmother   . Diabetes Maternal Grandmother   . Hyperlipidemia Maternal Grandfather   . Hypertension Maternal Grandfather   . Hyperlipidemia Paternal Grandmother   . Hypertension Paternal Grandmother   . Hyperlipidemia Paternal Grandfather   . Hypertension Paternal Grandfather     ROS:  Pertinent items are noted in HPI.  Otherwise, a comprehensive ROS was negative.  Exam:   BP 120/80   Pulse 70   Resp 16  Ht 5' 2.75" (1.594 m)   Wt 199 lb (90.3 kg)   LMP 08/09/2006   BMI 35.53 kg/m  Height: 5' 2.75" (159.4 cm) Ht Readings from Last 3 Encounters:  06/02/18 5' 2.75" (1.594 m)  04/26/17 5\' 3"  (1.6 m)  04/23/16 5\' 3"  (1.6 m)    General appearance: alert, cooperative and appears stated age Head: Normocephalic, without obvious abnormality, atraumatic Neck: no adenopathy, supple, symmetrical, trachea midline and thyroid normal to inspection and palpation Lungs: clear to auscultation bilaterally Breasts: normal appearance, no masses or tenderness, No nipple retraction or dimpling, No nipple discharge or bleeding, No axillary or supraclavicular adenopathy, reduction scarring Heart: regular rate and rhythm Abdomen: soft, non-tender; no masses,   no organomegaly Extremities: extremities normal, atraumatic, no cyanosis or edema Skin: Skin color, texture, turgor normal. No rashes or lesions Lymph nodes: Cervical, supraclavicular, and axillary nodes normal. No abnormal inguinal nodes palpated Neurologic: Grossly normal   Pelvic: External genitalia:  Normal female with decrease pigmentation noted bilateral on labia with slight thickness at bottom, slightly tender shown to patient with mirror, has Lichen Sclerosis characteristics.              Urethra:  normal appearing urethra with no masses, tenderness or lesions              Bartholin's and Skene's: normal                 Vagina: normal appearing vagina with normal color and discharge, no lesions              Cervix: no cervical motion tenderness, no lesions and normal appearance              Pap taken: Yes.   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 Feel this maybe the discomfort she is noting with sexual activity. Will begin treatment with Rx Clobetasol ointment, will need recheck in 2 weeks if no changes, may need skin biopsy. Questions addressed.  Rx Clobetasol ointment bid x 4 weeks, then once daily x 4 weeks. Printed and given to patient  Encouraged to continue weight journey for better health.  Rx Valtrex see order with instructions printed and  given to patient  Continue follow up with PCP as indicated.  Pap smear: yes  counseled on breast self exam, mammography screening, feminine hygiene, adequate intake of calcium and vitamin D, diet and exercise  Recheck Lichen in 2 weeks  return annually or prn as above  An After Visit Summary was printed and given to the patient.

## 2018-06-02 NOTE — Patient Instructions (Signed)
Lichen Sclerosus  Lichen sclerosus is a skin problem. It can happen on any part of the body, but it commonly involves the anal or genital areas. It can cause itching and discomfort in these areas. Treatment can help to control symptoms. When the genital area is affected, getting treatment is important because the condition can cause scarring that may lead to other problems.  What are the causes?  The cause of this condition is not known. It could be the result of an overactive immune system or a lack of certain hormones. Lichen sclerosus is not an infection or a fungus. It is not passed from one person to another (not contagious).  What increases the risk?  This condition is more likely to develop in women, usually after menopause.  What are the signs or symptoms?  Symptoms of this condition include:  · Thin, wrinkled, white areas on the skin.  · Thickened white areas on the skin.  · Red and swollen patches (lesions) on the skin.  · Tears or cracks in the skin.  · Bruising.  · Blood blisters.  · Severe itching.    You may also have pain, itching, or burning with urination. Constipation is also common in people with lichen sclerosus.  How is this diagnosed?  This condition may be diagnosed with a physical exam. In some cases, a tissue sample (biopsy sample) may be removed to be looked at under a microscope.  How is this treated?  This condition is usually treated with medicated creams or ointments (topical steroids) that are applied over the affected areas.  Follow these instructions at home:  · Take over-the-counter and prescription medicines only as told by your health care provider.  · Use creams or ointments as told by your health care provider.  · Do not scratch the affected areas of skin.  · Women should keep the vaginal area as clean and dry as possible.  · Keep all follow-up visits as told by your health care provider. This is important.  Contact a health care provider if:  · You have increasing redness,  swelling, or pain in the affected area.  · You have fluid, blood, or pus coming from the affected area.  · You have new lesions on your skin.  · You have a fever.  · You have pain during sex.  This information is not intended to replace advice given to you by your health care provider. Make sure you discuss any questions you have with your health care provider.  Document Released: 12/16/2010 Document Revised: 01/01/2016 Document Reviewed: 10/21/2014  Elsevier Interactive Patient Education © 2018 [employer]

## 2018-06-06 LAB — CYTOLOGY - PAP: Diagnosis: NEGATIVE

## 2018-06-20 ENCOUNTER — Encounter: Payer: Self-pay | Admitting: Certified Nurse Midwife

## 2018-06-20 ENCOUNTER — Ambulatory Visit (INDEPENDENT_AMBULATORY_CARE_PROVIDER_SITE_OTHER): Payer: 59 | Admitting: Certified Nurse Midwife

## 2018-06-20 ENCOUNTER — Other Ambulatory Visit: Payer: Self-pay

## 2018-06-20 DIAGNOSIS — L900 Lichen sclerosus et atrophicus: Secondary | ICD-10-CM

## 2018-06-20 MED ORDER — CLOBETASOL PROPIONATE 0.05 % EX OINT: TOPICAL_OINTMENT | CUTANEOUS | 1 refills | Status: DC

## 2018-06-20 NOTE — Progress Notes (Signed)
  Subjective:     Patient ID: Ashley Boyle, female   DOB: 24-Feb-1961, 57 y.o.   MRN: [id number]  57 yo african [place] female here for follow up of Lichen sclerosis noted at aex 06/06/18. Patient had complained of itching that would not go away. She has been applying Clobetasol Ointment twice daily x 2 weeks as directed. "So much better, does not hurt or itch as it did before." Did not has looked in mirror, but hard to tell area from last time. Denies any problems with using medication. No other health issues today.    Review of Systems  Constitutional: Negative.   HENT: Negative.   Eyes: Negative.   Respiratory: Negative.   Cardiovascular: Negative.   Endocrine: Negative.   Genitourinary: Negative for genital sores, vaginal discharge and vaginal pain.       Vaginal itching improving  Allergic/Immunologic: Negative.   Neurological: Negative.   Hematological: Negative.   Psychiatric/Behavioral: Negative.        Objective:   Physical Exam  Constitutional: She is oriented to person, place, and time. She appears well-developed and well-nourished.  Genitourinary:    Pelvic exam was performed with patient supine. There is no rash, tenderness, lesion or injury on the right labia. There is no rash, tenderness, lesion or injury on the left labia. No vaginal discharge found.  Lymphadenopathy: No inguinal adenopathy noted on the right or left side.  Neurological: She is alert and oriented to person, place, and time.  Skin: Skin is warm and dry.  Psychiatric: She has a normal mood and affect. Her behavior is normal. Judgment and thought content normal.       Assessment:     Lichen Sclerosis in vulva area responding well to Clobetasol Ointment use No new or changing areas noted    Plan:     Continue medication use reduce to once daily for 4 weeks. Call if issues with medication use. Cautioned to not over use medication due to thinning of skin in this area. Questions addressed.  RV on  month for recheck and prn

## 2018-06-20 NOTE — Patient Instructions (Signed)
Apply medication once daily for 4 weeks and recheck in office

## 2019-06-06 NOTE — Progress Notes (Signed)
58 y.o. [id number] Married  African American Fe here for annual exam. Menopausal no HRT. Denies vaginal bleeding or vaginal dryness. No Lichen flares.Still having hot flashes, and night sweats. Some insomnia, but coping well. No HSV outbreaks, does not desire medication update. Sees Dr. Ernie Hew for hypertension/cholesterol,medication management. All stable per patient . Has appointment for labs today. No other health issues today.  Patient's last menstrual period was 08/09/2006.          Sexually active: Yes.    The current method of family planning is tubal ligation.    Exercising: Yes.    stationary bike Smoker:  no  Review of Systems  Constitutional: Negative.   HENT: Negative.   Eyes: Negative.   Respiratory: Negative.   Cardiovascular: Negative.   Gastrointestinal: Negative.   Genitourinary: Negative.   Musculoskeletal: Negative.   Skin: Negative.   Neurological: Negative.   Endo/Heme/Allergies: Negative.   Psychiatric/Behavioral: Negative.     Health Maintenance: Pap:  04-23-16 neg HPV HR neg, 04-26-17 neg, 06-02-18 neg History of Abnormal Pap: yes MMG:  9/20 neg per patient, to be faxed Self Breast exams: yes Colonoscopy:  2012 f/u 17yrs BMD:  2018 f/u 2yrs low bone mass TDaP: 2018 Shingles: had done Pneumonia: had done Hep C and HIV: both neg 2017 Labs: if needed   reports that she quit smoking about 30 years ago. She has a 0.50 pack-year smoking history. She has never used smokeless tobacco. She reports that she does not drink alcohol or use drugs.  Past Medical History:  Diagnosis Date  . Abnormal Pap smear of cervix    in the past  . Gout   . High cholesterol   . Hypertension   . Obesity, unspecified   . Other abnormal glucose   . 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 BREAST SURGERY Bilateral 2007   reduction  . COLPOSCOPY  108/10   negative, repeat pap normal  . COSMETIC SURGERY      breast reduction  . TUBAL LIGATION  1991    Current Outpatient Medications  Medication Sig Dispense Refill  . clobetasol ointment (TEMOVATE) 0.05 % Apply thinly 1 time daily to affected area for 4 weeks 60 g 1  . Nebivolol HCl (BYSTOLIC) 20 MG TABS Take by mouth.    . Nutritional Supplements (ESTROVEN PO) Take by mouth.    . rosuvastatin (CRESTOR) 40 MG tablet     . valACYclovir (VALTREX) 500 MG tablet Take 2 tablets at onset of outbreak and continue for 3 days 45 tablet 12   No current facility-administered medications for this visit.     Family History  Problem Relation Age of Onset  . Hypertension Mother   . Cancer Mother        cervical  . Hyperlipidemia Mother   . Hypertension Father   . Hyperlipidemia Father   . Diabetes Other   . Cancer Other 40       breast  . Hyperlipidemia Brother   . Hypertension Brother   . Heart failure Maternal Aunt   . Hyperlipidemia Maternal Grandmother   . Hypertension Maternal Grandmother   . Diabetes Maternal Grandmother   . Hyperlipidemia Maternal Grandfather   . Hypertension Maternal Grandfather   . Hyperlipidemia Paternal Grandmother   . Hypertension Paternal Grandmother   . Hyperlipidemia Paternal Grandfather   . Hypertension Paternal Grandfather     ROS:  Pertinent items are noted in HPI.  Otherwise, a comprehensive ROS was negative.  Exam:   LMP 08/09/2006    Ht Readings from Last 3 Encounters:  06/20/18 5' 2.75" (1.594 m)  06/02/18 5' 2.75" (1.594 m)  04/26/17 5\' 3"  (1.6 m)    General appearance: alert, cooperative and appears stated age Head: Normocephalic, without obvious abnormality, atraumatic Neck: no adenopathy, supple, symmetrical, trachea midline and thyroid normal to inspection and palpation Lungs: clear to auscultation bilaterally Breasts: normal appearance, no masses or tenderness, No nipple retraction or dimpling, No nipple discharge or bleeding, No axillary or supraclavicular adenopathy Heart: regular  rate and rhythm Abdomen: soft, non-tender; no masses,  no organomegaly Extremities: extremities normal, atraumatic, no cyanosis or edema Skin: Skin color, texture, turgor normal. No rashes or lesions Lymph nodes: Cervical, supraclavicular, and axillary nodes normal. No abnormal inguinal nodes palpated Neurologic: Grossly normal   Pelvic: External genitalia:  no lesions,  No lichen changes noted              Urethra:  normal appearing urethra with no masses, tenderness or lesions              Bartholin's and Skene's: normal                 Vagina: normal appearing vagina with normal color and discharge, no lesions              Cervix: no cervical motion tenderness, no lesions and normal appearance              Pap taken: Yes.   Bimanual Exam:  Uterus:  normal size, contour, position, consistency, mobility, non-tender              Adnexa: normal adnexa and no mass, fullness, tenderness               Rectovaginal: Confirms               Anus:  normal sphincter tone, no lesions  Chaperone present: yes  A:  Well Woman with normal exam  Post menopausal no HRT  History of Lichen Sclerosis and HSV no flares  Hypertension/cholesterol/lab management with PCP  Colonoscopy due next year  P:   Reviewed health and wellness pertinent to exam  Aware she needs to advise if vaginal bleeding.  Will advise if changes occur or if medication needed.  Continue follow up with PCP as indicated.  Pap smear: yes   counseled on breast self exam, feminine hygiene, menopause, adequate intake of calcium and vitamin D, diet and exercise return annually or prn  An After Visit Summary was printed and given to the patient.

## 2019-06-08 ENCOUNTER — Other Ambulatory Visit (HOSPITAL_COMMUNITY)
Admission: RE | Admit: 2019-06-08 | Discharge: 2019-06-08 | Disposition: A | Discharge status: Home / Self Care | Payer: No Typology Code available for payment source | Source: Ambulatory Visit | Attending: Certified Nurse Midwife | Admitting: Certified Nurse Midwife

## 2019-06-08 ENCOUNTER — Other Ambulatory Visit: Payer: Self-pay

## 2019-06-08 ENCOUNTER — Encounter: Payer: Self-pay | Admitting: Certified Nurse Midwife

## 2019-06-08 ENCOUNTER — Ambulatory Visit (INDEPENDENT_AMBULATORY_CARE_PROVIDER_SITE_OTHER): Payer: No Typology Code available for payment source | Admitting: Certified Nurse Midwife

## 2019-06-08 VITALS — BP 118/80 | HR 70 | Temp 97.1°F | Resp 16 | Ht 63.0 in | Wt 203.0 lb

## 2019-06-08 DIAGNOSIS — Z124 Encounter for screening for malignant neoplasm of cervix: Secondary | ICD-10-CM

## 2019-06-08 DIAGNOSIS — Z01419 Encounter for gynecological examination (general) (routine) without abnormal findings: Secondary | ICD-10-CM

## 2019-06-08 DIAGNOSIS — Z8679 Personal history of other diseases of the circulatory system: Secondary | ICD-10-CM

## 2019-06-08 DIAGNOSIS — N951 Menopausal and female climacteric states: Secondary | ICD-10-CM

## 2019-06-08 NOTE — Patient Instructions (Signed)

## 2019-06-13 LAB — CYTOLOGY - PAP
Comment: NEGATIVE
Diagnosis: NEGATIVE
High risk HPV: NEGATIVE

## 2019-09-26 ENCOUNTER — Ambulatory Visit: Payer: Self-pay

## 2019-09-28 ENCOUNTER — Ambulatory Visit: Payer: Self-pay

## 2019-10-01 ENCOUNTER — Ambulatory Visit: Payer: Self-pay

## 2019-10-05 ENCOUNTER — Ambulatory Visit: Payer: 59 | Attending: Internal Medicine

## 2019-10-05 DIAGNOSIS — Z20822 Contact with and (suspected) exposure to covid-19: Secondary | ICD-10-CM

## 2019-10-07 LAB — NOVEL CORONAVIRUS, NAA: SARS-CoV-2, NAA: NOT DETECTED

## 2019-10-20 ENCOUNTER — Ambulatory Visit: Payer: Self-pay

## 2019-10-31 ENCOUNTER — Encounter: Payer: Self-pay | Admitting: Certified Nurse Midwife

## 2019-11-09 ENCOUNTER — Ambulatory Visit: Payer: 59 | Attending: Internal Medicine

## 2019-11-09 DIAGNOSIS — Z20822 Contact with and (suspected) exposure to covid-19: Secondary | ICD-10-CM

## 2019-11-10 LAB — NOVEL CORONAVIRUS, NAA: SARS-CoV-2, NAA: NOT DETECTED

## 2019-11-10 LAB — SARS-COV-2, NAA 2 DAY TAT

## 2020-08-04 NOTE — Progress Notes (Signed)
59 y.o. [id number] Married [place] American female here for annual exam.    States has not had any problems with lichen sclerosis. Was never verified with biopsy, was given dx based on clinical appearance.  Patient's last menstrual period was 08/09/2006.          Sexually active: Yes.    The current method of family planning is tubal ligation.    Exercising: Yes.    pace walking 4-5 times a week Smoker:  no  Health Maintenance: Pap:  06-08-2019 neg HPV HR neg History of abnormal Pap:  Yes (no CIN II or greater, many years ago) MMG:  Will call for report Colonoscopy:  2012 f/u 75yrs BMD:   2018 f/u 55yrs TDaP:  2018 Gardasil:   n/a Covid-19: 2 moderna & 1 pfizer Hep C testing: neg 2017 Screening Labs: with PCP   reports that she has quit smoking. She has never used smokeless tobacco. She reports that she does not drink alcohol and does not use drugs.  Past Medical History:  Diagnosis Date  . Abnormal Pap smear of cervix    in the past  . Gout   . High cholesterol   . Hypertension   . Obesity, unspecified   . Other abnormal glucose   . 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 BREAST SURGERY Bilateral 2007   reduction  . COLPOSCOPY  108/10   negative, repeat pap normal  . COSMETIC SURGERY     breast reduction  . TUBAL LIGATION  1991    Current Outpatient Medications  Medication Sig Dispense Refill  . cloNIDine (CATAPRES) 0.2 MG tablet Take 0.2 mg by mouth 2 (two) times daily.    . rosuvastatin (CRESTOR) 40 MG tablet      No current facility-administered medications for this visit.    Family History  Problem Relation Age of Onset  . Hypertension Mother   . Cancer Mother        cervical  . Hyperlipidemia Mother   . Hypertension Father   . Hyperlipidemia Father   . Diabetes Other   . Cancer Other 40       breast  . Hyperlipidemia Brother   . Hypertension Brother   . Heart  failure Maternal Aunt   . Hyperlipidemia Maternal Grandmother   . Hypertension Maternal Grandmother   . Diabetes Maternal Grandmother   . Hyperlipidemia Maternal Grandfather   . Hypertension Maternal Grandfather   . Hyperlipidemia Paternal Grandmother   . Hypertension Paternal Grandmother   . Hyperlipidemia Paternal Grandfather   . Hypertension Paternal Grandfather     Review of Systems  Constitutional: Negative.   HENT: Negative.   Eyes: Negative.   Respiratory: Negative.   Cardiovascular: Negative.   Gastrointestinal: Negative.   Endocrine: Negative.   Genitourinary: Negative.        Vaginal dryness  Musculoskeletal: Negative.   Skin: Negative.   Allergic/Immunologic: Negative.   Neurological: Negative.   Hematological: Negative.   Psychiatric/Behavioral: Negative.     Exam:   BP 120/78   Pulse 68   Resp 16   Ht 5' 2.75" (1.594 m)   Wt 199 lb (90.3 kg)   LMP 08/09/2006   BMI 35.53 kg/m   Height: 5' 2.75" (159.4 cm)  General appearance: alert, cooperative and appears stated age, no acute distress Head: Normocephalic, without obvious abnormality Neck: no adenopathy, thyroid normal to inspection  and palpation Lungs: clear to auscultation bilaterally Breasts: normal appearance, no masses or tenderness, breast reduction surgical scars Heart: regular rate and rhythm Abdomen: soft, non-tender; no masses,  no organomegaly Extremities: extremities normal, no edema Skin: No rashes or lesions Lymph nodes: Cervical, supraclavicular, and axillary nodes normal. No abnormal inguinal nodes palpated Neurologic: Grossly normal   Pelvic: External genitalia:  no lesions, no evidence of lichen sclerosis              Urethra:  normal appearing urethra with no masses, tenderness or lesions              Bartholins and Skenes: normal                 Vagina: normal appearing vagina, appropriate for age, normal appearing discharge, no lesions              Cervix: neg cervical motion  tenderness, no visible lesions             Bimanual Exam:   Uterus:  normal size, contour, position, consistency, mobility, non-tender              Adnexa: no mass, fullness, tenderness               Rectal: no palpable mass  Lovena Le, CMA Chaperone was present for exam.  A:  Well Woman with normal exam  Vaginal dryness  P:   Pap : due 2023  Mammogram: was done recently, will request report  Labs: with PCP  Medications: no new medications  Colonoscopy: has consult appointment 08/21/2020  Discussed coconut oil, replens, and Good Clean Love for vaginal moisture

## 2020-08-05 ENCOUNTER — Ambulatory Visit (INDEPENDENT_AMBULATORY_CARE_PROVIDER_SITE_OTHER): Payer: No Typology Code available for payment source | Admitting: Nurse Practitioner

## 2020-08-05 ENCOUNTER — Ambulatory Visit: Payer: No Typology Code available for payment source | Admitting: Nurse Practitioner

## 2020-08-05 ENCOUNTER — Other Ambulatory Visit: Payer: Self-pay

## 2020-08-05 ENCOUNTER — Encounter: Payer: Self-pay | Admitting: Nurse Practitioner

## 2020-08-05 VITALS — BP 120/78 | HR 68 | Resp 16 | Ht 62.75 in | Wt 199.0 lb

## 2020-08-05 DIAGNOSIS — Z01419 Encounter for gynecological examination (general) (routine) without abnormal findings: Secondary | ICD-10-CM

## 2020-08-05 NOTE — Patient Instructions (Signed)

## 2020-10-03 ENCOUNTER — Other Ambulatory Visit: Payer: Self-pay | Admitting: Family Medicine

## 2020-10-03 DIAGNOSIS — N289 Disorder of kidney and ureter, unspecified: Secondary | ICD-10-CM

## 2020-11-17 ENCOUNTER — Other Ambulatory Visit: Payer: No Typology Code available for payment source

## 2021-01-07 DIAGNOSIS — N186 End stage renal disease: Secondary | ICD-10-CM | POA: Insufficient documentation

## 2021-05-25 ENCOUNTER — Telehealth: Payer: Self-pay | Admitting: *Deleted

## 2021-05-25 NOTE — Telephone Encounter (Signed)
Patient had questions about when next GYN annual exam is due. Claiborne Billings said repeat pap smear in 3 years. Patient aware to come annually.

## 2021-09-22 ENCOUNTER — Ambulatory Visit: Payer: No Typology Code available for payment source | Admitting: Obstetrics and Gynecology

## 2021-09-25 NOTE — Progress Notes (Signed)
61 y.o. G29P2002 Married [place] American female here for annual exam.   ? ?No gynecologic concerns.  ? ?Patient has end stage renal disease.  ?She is an a transplant list.  ?She is not on dialysis.  ? ?PCP: Rachell Cipro, MD   ? ?Patient's last menstrual period was 08/09/2006.     ?  ?    ?Sexually active: Yes.    ?The current method of family planning is post menopausal status.    ?Exercising: Yes.     Aerobics 5x a week  ?Smoker:  no ? ?Health Maintenance: ?Pap: 06/08/2019-WNL, HPV- neg, 06/02/18- WNL, 04/26/17-WNL ?History of abnormal Pap:  yes, no CIN II or greater, many years ago ?MMG: 05/2021-WNL per pt ?Colonoscopy: 10/2020--f/u in 5 yrs  ?BMD: 2018 Result osteopenia ?TDaP: 2018 ?Gardasil:   no ?HIV: 01/06/21-NR ?Hep C: 01/06/21- neg ?Screening Labs:   PCP, nephrology ? ? reports that she has quit smoking. She has never used smokeless tobacco. She reports that she does not drink alcohol and does not use drugs. ? ?Past Medical History:  ?Diagnosis Date  ? Abnormal Pap smear of cervix   ? in the past  ? End stage renal disease ([place])   ? on renal transplant list  ? Gout   ? High cholesterol   ? Hypertension   ? Obesity, unspecified   ? Other abnormal glucose   ? STD (sexually transmitted disease)   ? HSV I & II, pos IgG  ? ? ?Past Surgical History:  ?Procedure Laterality Date  ? BREAST SURGERY Bilateral 2007  ? reduction  ? COLPOSCOPY  108/10  ? negative, repeat pap normal  ? COSMETIC SURGERY    ? breast reduction  ? TUBAL LIGATION  1991  ? ? ?Current Outpatient Medications  ?Medication Sig Dispense Refill  ? carvedilol (COREG) 25 MG tablet Take 25 mg by mouth 2 (two) times daily.    ? chlorthalidone (HYGROTON) 25 MG tablet 12.5 mg daily.    ? cloNIDine (CATAPRES) 0.2 MG tablet Take 0.2 mg by mouth 2 (two) times daily.    ? rosuvastatin (CRESTOR) 40 MG tablet     ? ?No current facility-administered medications for this visit.  ? ? ?Family History  ?Problem Relation Age of Onset  ? Hypertension Mother   ? Cancer  Mother   ?     cervical  ? Hyperlipidemia Mother   ? Hypertension Father   ? Hyperlipidemia Father   ? Diabetes Other   ? Cancer Other 26  ?     breast  ? Hyperlipidemia Brother   ? Hypertension Brother   ? Heart failure Maternal Aunt   ? Hyperlipidemia Maternal Grandmother   ? Hypertension Maternal Grandmother   ? Diabetes Maternal Grandmother   ? Hyperlipidemia Maternal Grandfather   ? Hypertension Maternal Grandfather   ? Hyperlipidemia Paternal Grandmother   ? Hypertension Paternal Grandmother   ? Hyperlipidemia Paternal Grandfather   ? Hypertension Paternal Grandfather   ? ? ?Review of Systems  ?All other systems reviewed and are negative. ? ?Exam:   ?BP (!) 144/82   Pulse (!) 53   Ht 5' 2.75" (1.594 m)   Wt 183 lb (83 kg)   LMP 08/09/2006   SpO2 97%   BMI 32.68 kg/m?     ?General appearance: alert, cooperative and appears stated age ?Head: normocephalic, without obvious abnormality, atraumatic ?Neck: no adenopathy, supple, symmetrical, trachea midline and thyroid normal to inspection and palpation ?Lungs: clear to auscultation bilaterally ?Breasts: normal  appearance, no masses or tenderness, No nipple retraction or dimpling, No nipple discharge or bleeding, No axillary adenopathy ?Heart: regular rate and rhythm ?Abdomen: soft, non-tender; no masses, no organomegaly ?Extremities: extremities normal, atraumatic, no cyanosis or edema ?Skin: skin color, texture, turgor normal. No rashes or lesions ?Lymph nodes: cervical, supraclavicular, and axillary nodes normal. ?Neurologic: grossly normal ? ?Pelvic: External genitalia:  no lesions ?             No abnormal inguinal nodes palpated. ?             Urethra:  normal appearing urethra with no masses, tenderness or lesions ?             Bartholins and Skenes: normal    ?             Vagina: normal appearing vagina with normal color and discharge, no lesions ?             Cervix: no lesions ?             Pap taken: yes ?Bimanual Exam:  Uterus:  normal size,  contour, position, consistency, mobility, non-tender ?             Adnexa: no mass, fullness, tenderness ?             Rectal exam: yes.  Confirms. ?             Anus:  normal sphincter tone, no lesions ? ?Chaperone was present for exam:  Lovena Le, CMA ? ?Assessment:   ?Well woman visit with gynecologic exam. 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  ?End stage renal disease.  On kidney transplant list.  ?Hx HSV I and II. ? ?Plan: ?Mammogram screening discussed. ?Self breast awareness reviewed. ?Pap and HR HPV collected.  ?Guidelines for Calcium, Vitamin D, regular exercise program including cardiovascular and weight bearing exercise. ?We discussed her decreased kidney function and need for potential modification of medication dosing whenever she is taking something.  ?Follow up annually and prn.  ? ?After visit summary provided.  ? ? ? ?

## 2021-10-09 ENCOUNTER — Encounter: Payer: Self-pay | Admitting: Obstetrics and Gynecology

## 2021-10-09 ENCOUNTER — Other Ambulatory Visit: Payer: Self-pay

## 2021-10-09 ENCOUNTER — Other Ambulatory Visit (HOSPITAL_COMMUNITY)
Admission: RE | Admit: 2021-10-09 | Discharge: 2021-10-09 | Disposition: A | Discharge status: Home / Self Care | Payer: No Typology Code available for payment source | Source: Ambulatory Visit | Attending: Obstetrics and Gynecology | Admitting: Obstetrics and Gynecology

## 2021-10-09 ENCOUNTER — Ambulatory Visit (INDEPENDENT_AMBULATORY_CARE_PROVIDER_SITE_OTHER): Payer: No Typology Code available for payment source | Admitting: Obstetrics and Gynecology

## 2021-10-09 VITALS — BP 144/82 | HR 53 | Ht 62.75 in | Wt 183.0 lb

## 2021-10-09 DIAGNOSIS — Z01419 Encounter for gynecological examination (general) (routine) without abnormal findings: Secondary | ICD-10-CM

## 2021-10-09 DIAGNOSIS — Z124 Encounter for screening for malignant neoplasm of cervix: Secondary | ICD-10-CM | POA: Diagnosis present

## 2021-10-09 NOTE — Patient Instructions (Signed)

## 2021-10-14 LAB — CYTOLOGY - PAP
Adequacy: ABSENT
Comment: NEGATIVE
Diagnosis: NEGATIVE
High risk HPV: NEGATIVE

## 2021-12-09 ENCOUNTER — Encounter (HOSPITAL_COMMUNITY): Payer: Self-pay

## 2021-12-09 ENCOUNTER — Ambulatory Visit (HOSPITAL_COMMUNITY)
Admission: EM | Admit: 2021-12-09 | Discharge: 2021-12-09 | Disposition: A | Discharge status: Home / Self Care | Payer: No Typology Code available for payment source | Attending: Family Medicine | Admitting: Family Medicine

## 2021-12-09 DIAGNOSIS — M109 Gout, unspecified: Secondary | ICD-10-CM

## 2021-12-09 MED ORDER — PREDNISONE 20 MG PO TABS: 40.0000 mg | ORAL_TABLET | Freq: Every day | ORAL | 0 refills | Status: DC

## 2021-12-09 NOTE — ED Triage Notes (Signed)
Pt c/o left leg pain. Pt states been going on for 2 weeks. ?

## 2021-12-12 NOTE — ED Provider Notes (Signed)
?[place] ? ? ?[phone] ?12/09/21 Arrival Time: 1604 ? ?ASSESSMENT & PLAN: ? ?1. Podagra   ? ?No injury. No indication for imaging. ?No signs of skin infection. ? ?Reports prednisone has helped in the past. Begin: ?Discharge Medication List as of 12/09/2021  5:16 PM  ?  ? ?START taking these medications  ? Details  ?predniSONE (DELTASONE) 20 MG tablet Take 2 tablets (40 mg total) by mouth daily., Starting Wed 12/09/2021, Normal  ?  ?  ? ? ? ?Recommend: ? Follow-up Information   ? ? Fanny Bien, MD.   ?Specialty: Family Medicine ?Why: If worsening or failing to improve as anticipated. ?Contact information: ?[address] ?[place] [address] ?[phone] ? ? ?  ?  ? ?  ?  ? ?  ? ? ?Reviewed expectations re: course of current medical issues. Questions answered. ?Outlined signs and symptoms indicating need for more acute intervention. ?Patient verbalized understanding. ?After Visit Summary given. ? ?SUBJECTIVE: ?History from: patient. ?Ashley Boyle is a 61 y.o. female who reports LEFT great toe pain; h/o gout; "feels same". No extremity sensation changes or weakness. Ambulatory. No injury/trauma. No tx PTA. ? ? ?Past Surgical History:  ?Procedure Laterality Date  ? BREAST SURGERY Bilateral 2007  ? reduction  ? COLPOSCOPY  108/10  ? negative, repeat pap normal  ? COSMETIC SURGERY    ? breast reduction  ? TUBAL LIGATION  1991  ?  ? ? ?OBJECTIVE: ? ?Vitals:  ? 12/09/21 1646  ?BP: (!) 160/81  ?Pulse: 87  ?Resp: 17  ?Temp: 98.3 ?F (36.8 ?C)  ?TempSrc: Oral  ?SpO2: 97%  ?  ?General appearance: alert; no distress ?HEENT: [place]; AT ?Neck: supple with FROM ?Resp: unlabored respirations ?Extremities: ?LLE: warm with well perfused appearance; very TTP over LEFT 1st MTP joint; mild overlying erythema ?CV: brisk extremity capillary refill of LLE; 2+ DP pulse of LLE. ?Skin: warm and dry; no visible rashes ?Neurologic: gait normal; normal sensation and strength of LLE ?Psychological: alert and cooperative; normal  mood and affect ? ? ? ?Allergies  ?Allergen Reactions  ? Bactrim [Sulfamethoxazole-Trimethoprim]   ?  rash  ? Ceftin [Cefuroxime]   ?  rash  ? Crestor [Rosuvastatin] Other (See Comments)  ?  myalgias  ? Lipitor [Atorvastatin] Other (See Comments)  ?  myalgias  ? Vytorin [Ezetimibe-Simvastatin] Other (See Comments)  ?  myalgias  ? ? ?Past Medical History:  ?Diagnosis Date  ? Abnormal Pap smear of cervix   ? in the past  ? End stage renal disease ([place])   ? on renal transplant list  ? Gout   ? High cholesterol   ? Hypertension   ? Obesity, unspecified   ? Other abnormal glucose   ? STD (sexually transmitted disease)   ? HSV I & II, pos IgG  ? ?Social History  ? ?Socioeconomic History  ? Marital status: Married  ?  Spouse name: Not on file  ? Number of children: Not on file  ? Years of education: Not on file  ? Highest education level: Not on file  ?Occupational History  ? Not on file  ?Tobacco Use  ? Smoking status: Former  ? Smokeless tobacco: Never  ?Substance and Sexual Activity  ? Alcohol use: No  ? Drug use: No  ? Sexual activity: Yes  ?  Partners: Male  ?  Birth control/protection: Surgical, Post-menopausal  ?  Comment: BTL  ?Other Topics Concern  ? Not on file  ?Social History Narrative  ?  Not on file  ? ?Social Determinants of Health  ? ?Financial Resource Strain: Not on file  ?Food Insecurity: Not on file  ?Transportation Needs: Not on file  ?Physical Activity: Not on file  ?Stress: Not on file  ?Social Connections: Not on file  ? ?Family History  ?Problem Relation Age of Onset  ? Hypertension Mother   ? Cancer Mother   ?     cervical  ? Hyperlipidemia Mother   ? Hypertension Father   ? Hyperlipidemia Father   ? Diabetes Other   ? Cancer Other 78  ?     breast  ? Hyperlipidemia Brother   ? Hypertension Brother   ? Heart failure Maternal Aunt   ? Hyperlipidemia Maternal Grandmother   ? Hypertension Maternal Grandmother   ? Diabetes Maternal Grandmother   ? Hyperlipidemia Maternal Grandfather   ? Hypertension  Maternal Grandfather   ? Hyperlipidemia Paternal Grandmother   ? Hypertension Paternal Grandmother   ? Hyperlipidemia Paternal Grandfather   ? Hypertension Paternal Grandfather   ? ?Past Surgical History:  ?Procedure Laterality Date  ? BREAST SURGERY Bilateral 2007  ? reduction  ? COLPOSCOPY  108/10  ? negative, repeat pap normal  ? COSMETIC SURGERY    ? breast reduction  ? TUBAL LIGATION  1991  ? ? ? ?  ?Vanessa Kick, MD ?12/12/21 0840 ? ?

## 2021-12-21 ENCOUNTER — Ambulatory Visit (INDEPENDENT_AMBULATORY_CARE_PROVIDER_SITE_OTHER): Payer: No Typology Code available for payment source

## 2021-12-21 ENCOUNTER — Ambulatory Visit (INDEPENDENT_AMBULATORY_CARE_PROVIDER_SITE_OTHER): Payer: No Typology Code available for payment source | Admitting: Podiatry

## 2021-12-21 DIAGNOSIS — M7752 Other enthesopathy of left foot: Secondary | ICD-10-CM

## 2021-12-21 DIAGNOSIS — M21611 Bunion of right foot: Secondary | ICD-10-CM | POA: Diagnosis not present

## 2021-12-21 DIAGNOSIS — M21612 Bunion of left foot: Secondary | ICD-10-CM

## 2021-12-21 MED ORDER — TRIAMCINOLONE ACETONIDE 10 MG/ML IJ SUSP
10.0000 mg | Freq: Once | INTRAMUSCULAR | Status: AC
  Administered 2021-12-21: 10 mg

## 2021-12-21 NOTE — Patient Instructions (Signed)
While at your visit today you received a steroid injection in your foot or ankle to help with your pain. Along with having the steroid medication there is some "numbing" medication in the shot that you received. Due to this you may notice some numbness to the area for the next couple of hours.   I would recommend limiting activity for the next few days to help the steroid injection take affect.    The actually benefit from the steroid injection may take up to 2-7 days to see a difference. You may actually experience a small (as in 10%) INCREASE in pain in the first 24 hours---that is common. It would be best if you can ice the area today and take anti-inflammatory medications (such as Ibuprofen, Motrin, or Aleve) if you are able to take these medications. If you were prescribed another medication to help with the pain go ahead and start that medication today    Things to watch out for that you should contact us or a health care provider urgently would include: 1. Unusual (as in more than 10%) increase in pain 2. New fever > 101.5 3. New swelling or redness of the injected area.  4. Streaking of red lines around the area injected.  If you have any questions or concerns about this, please give our office a call at [phone].    Bunion A bunion (hallux valgus) is a bump that forms slowly on the inner side of the big toe joint. It occurs when the big toe turns toward the second toe. Bunions may be small at first, but they often get larger over time. They can make walking painful. What are the causes? This condition may be caused by: Wearing narrow or pointed shoes that force the big toe to press against the other toes. Abnormal foot development that causes the foot to roll inward. Changes in the foot that are caused by certain diseases, such as rheumatoid arthritis or polio. A foot injury. What increases the risk? The following factors may make you more likely to develop this  condition: Wearing shoes that squeeze the toes together. Having certain diseases, such as: Rheumatoid arthritis. Polio. Cerebral palsy. Having family members who have bunions. Being born with abnormally shaped feet (a foot deformity), such as flat feet or low arches. Doing activities that put a lot of pressure on the feet, such as ballet dancing. What are the signs or symptoms?  The main symptom of this condition is a bump on your big toe that you can notice. Other symptoms may include: Pain. Redness and inflammation around your big toe. Thick or hardened skin on your big toe or between your toes. Stiffness or loss of motion in your big toe. Trouble with walking. How is this diagnosed? This condition may be diagnosed based on your symptoms, medical history, and activities. You may also have tests and imaging, such as: X-rays. These allow your health care provider to check the position of the bones in your foot and look for damage to your joint. They also help your health care provider determine the severity of your bunion and the best way to treat it. Joint aspiration. In this test, a sample of fluid is removed from the toe joint. This test may be done if you are in a lot of pain. It helps rule out diseases that cause painful swelling of the joints, such as arthritis or gout. How is this treated? Treatment depends on the severity of your symptoms. The goal of   treatment is to relieve symptoms and prevent your bunion from getting worse. Your health care provider may recommend: Wearing shoes that have a wide toe box, or using bunion pads to cushion the affected area. Taping your toes together to keep them in a normal position. Placing a device inside your shoe (orthotic device) to help reduce pressure on your toe joint. Taking medicine to ease pain and inflammation. Putting ice or heat on the affected area. Doing stretching exercises. Surgery, for severe cases. Follow these instructions  at home: Managing pain, stiffness, and swelling     If directed, put ice on the painful area. To do this: Put ice in a plastic bag. Place a towel between your skin and the bag. Leave the ice on for 20 minutes, 2-3 times a day. Remove the ice if your skin turns bright red. This is very important. If you cannot feel pain, heat, or cold, you have a greater risk of damage to the area. If directed, apply heat to the affected area before you exercise. Use the heat source that your health care provider recommends, such as a moist heat pack or a heating pad. Place a towel between your skin and the heat source. Leave the heat on for 20-30 minutes. Remove the heat if your skin turns bright red. This is especially important if you are unable to feel pain, heat, or cold. You have a greater risk of getting burned. General instructions Do exercises as told by your health care provider. Support your toe joint with proper footwear, shoe padding, or taping as told by your health care provider. Take over-the-counter and prescription medicines only as told by your health care provider. Do not use any products that contain nicotine or tobacco, such as cigarettes, e-cigarettes, and chewing tobacco. If you need help quitting, ask your health care provider. Keep all follow-up visits. This is important. Contact a health care provider if: Your symptoms get worse. Your symptoms do not improve in 2 weeks. Get help right away if: You have severe pain and trouble with walking. Summary A bunion is a bump on the inner side of the big toe joint that forms when the big toe turns toward the second toe. Bunions can make walking painful. Treatment depends on the severity of your symptoms. Support your toe joint with proper footwear, shoe padding, or taping as told by your health care provider. This information is not intended to replace advice given to you by your health care provider. Make sure you discuss any questions  you have with your health care provider. Document Revised: 11/30/2019 Document Reviewed: 11/30/2019 Elsevier Patient Education  2023 Elsevier Inc.  

## 2021-12-23 NOTE — Progress Notes (Signed)
Subjective:  ? ?Patient ID: Ashley Boyle, female   DOB: 61 y.o.   MRN: [id number]  ? ?HPI ?62 year old female presents the office today for concerns of bilateral bunions with left side worse than the right.  She has tried shoe modifications as well as off without significant improvement.  She does have a history of gout and that has improved.  She states this started after she ate a lot of walnuts.  All of the gout symptoms have improved she is continue get pain along the bunion as well as some mild swelling.  Please place her on prednisone for this as well as trying ice, heat.  No injuries. ? ? ?Review of Systems  ?All other systems reviewed and are negative. ?   ? ?Past Medical History:  ?Diagnosis Date  ? Abnormal Pap smear of cervix   ? in the past  ? End stage renal disease ([place])   ? on renal transplant list  ? Gout   ? High cholesterol   ? Hypertension   ? Obesity, unspecified   ? Other abnormal glucose   ? STD (sexually transmitted disease)   ? HSV I & II, pos IgG  ? ? ?Past Surgical History:  ?Procedure Laterality Date  ? BREAST SURGERY Bilateral 2007  ? reduction  ? COLPOSCOPY  108/10  ? negative, repeat pap normal  ? COSMETIC SURGERY    ? breast reduction  ? TUBAL LIGATION  1991  ? ? ? ?Current Outpatient Medications:  ?  carvedilol (COREG) 25 MG tablet, Take 25 mg by mouth 2 (two) times daily., Disp: , Rfl:  ?  chlorthalidone (HYGROTON) 25 MG tablet, 12.5 mg daily., Disp: , Rfl:  ?  cloNIDine (CATAPRES) 0.2 MG tablet, Take 0.2 mg by mouth 2 (two) times daily. (Patient not taking: Reported on 12/21/2021), Disp: , Rfl:  ?  predniSONE (DELTASONE) 20 MG tablet, Take 2 tablets (40 mg total) by mouth daily. (Patient not taking: Reported on 12/21/2021), Disp: 10 tablet, Rfl: 0 ?  rosuvastatin (CRESTOR) 40 MG tablet, , Disp: , Rfl:  ? ?Allergies  ?Allergen Reactions  ? Bactrim [Sulfamethoxazole-Trimethoprim]   ?  rash  ? Ceftin [Cefuroxime]   ?  rash  ? Crestor [Rosuvastatin] Other (See Comments)  ?  myalgias  ?  Lipitor [Atorvastatin] Other (See Comments)  ?  myalgias  ? Vytorin [Ezetimibe-Simvastatin] Other (See Comments)  ?  myalgias  ? ? ? ? ? ?   ?Objective:  ?Physical Exam  ?General: AAO x3, NAD ? ?Dermatological: Skin is warm, dry and supple bilateral.  There are no open sores, no preulcerative lesions, no rash or signs of infection present. ? ?Vascular: Dorsalis Pedis artery and Posterior Tibial artery pedal pulses are 2/4 bilateral with immedate capillary fill time. There is no pain with calf compression, swelling, warmth, erythema.  ? ?Neruologic: Grossly intact via light touch bilateral.  ? ?Musculoskeletal: Moderate bunions present bilaterally left side worse than the right.  There is localized edema but no erythema or warmth.  There is tenderness palpation sharply on the medial eminence of the first MPJ.  No hypermobility present.  Muscular strength 5/5 in all groups tested bilateral. ? ?Gait: Unassisted, Nonantalgic.  ? ? ?   ?Assessment:  ? ?62 year old female with bunion, capsulitis with history of gout ? ?   ?Plan:  ?-Treatment options discussed including all alternatives, risks, and complications ?-Etiology of symptoms were discussed ?-X-rays were obtained and reviewed with the patient.  3 views of bilateral feet  were obtained.  Moderate bunions are present bilaterally.  Metatarsus adductus present.  Decreased joint space on the first MPJ bilaterally.  No evidence of acute fracture. ?-We discussed both conservative as well as surgical options.  Today steroid injection was performed.  Skin was cleaned Betadine, alcohol mixture 1 cc Kenalog 10, 0.5 cc of Marcaine plain, 0.5 cc of lidocaine plain was infiltrated into around the first MPJ on the left foot.  Postinjection care was discussed.  Continue offloading shoe modifications for now as well as wearing shoes and good arch support. ?-She wants to consider surgery.  Discussed with her likely first MPJ arthrodesis versus first metatarsal osteotomy if the joint  looked adequate. ? ?Trula Slade DPM ? ?   ? ?

## 2022-01-20 ENCOUNTER — Other Ambulatory Visit: Payer: Self-pay | Admitting: Nephrology

## 2022-01-20 DIAGNOSIS — N281 Cyst of kidney, acquired: Secondary | ICD-10-CM

## 2022-02-08 ENCOUNTER — Ambulatory Visit
Admission: RE | Admit: 2022-02-08 | Discharge: 2022-02-08 | Disposition: A | Discharge status: Home / Self Care | Payer: No Typology Code available for payment source | Source: Ambulatory Visit | Attending: Nephrology | Admitting: Nephrology

## 2022-02-08 DIAGNOSIS — N281 Cyst of kidney, acquired: Secondary | ICD-10-CM

## 2022-02-08 MED ORDER — GADOBENATE DIMEGLUMINE 529 MG/ML IV SOLN
18.0000 mL | Freq: Once | INTRAVENOUS | Status: AC | PRN
  Administered 2022-02-08: 18 mL via INTRAVENOUS

## 2022-09-27 NOTE — Progress Notes (Deleted)
62 y.o. G77P2002 Married [place] American female here for annual exam.    PCP:     Patient's last menstrual period was 08/09/2006.           Sexually active: {yes no:314532}  The current method of family planning is post menopausal status.    Exercising: {yes no:314532}  {types:19826} Smoker:  {YES [id number]  Health Maintenance: Pap:  10/09/21 neg: HR HPV neg,06/08/2019-WNL, HPV- neg, 06/02/18- WNL, 04/26/17-WN  History of abnormal Pap:  yes, no CIN II or greater, many years ag  MMG:  05/2021 WNL per pt Colonoscopy:  10/2020 f/u in 5 years BMD:   2018  Result  osteopenia TDaP:  2018 Gardasil:   no HIV: 01/06/21 NR Hep C: 01/06/21 Neg Screening Labs:  Hb today: ***, Urine today: ***   reports that she has quit smoking. She has never used smokeless tobacco. She reports that she does not drink alcohol and does not use drugs.  Past Medical History:  Diagnosis Date   Abnormal Pap smear of cervix    in the past   End stage renal disease ([place])    on renal transplant list   Gout    High cholesterol    Hypertension    Obesity, unspecified    Other abnormal glucose    STD (sexually transmitted disease)    HSV I & II, pos IgG    Past Surgical History:  Procedure Laterality Date   BREAST SURGERY Bilateral 2007   reduction   COLPOSCOPY  108/10   negative, repeat pap normal   COSMETIC SURGERY     breast reduction   TUBAL LIGATION  1991    Current Outpatient Medications  Medication Sig Dispense Refill   carvedilol (COREG) 25 MG tablet Take 25 mg by mouth 2 (two) times daily.     chlorthalidone (HYGROTON) 25 MG tablet 12.5 mg daily.     cloNIDine (CATAPRES) 0.2 MG tablet Take 0.2 mg by mouth 2 (two) times daily. (Patient not taking: Reported on 12/21/2021)     predniSONE (DELTASONE) 20 MG tablet Take 2 tablets (40 mg total) by mouth daily. (Patient not taking: Reported on 12/21/2021) 10 tablet 0   rosuvastatin (CRESTOR) 40 MG tablet      No current facility-administered  medications for this visit.    Family History  Problem Relation Age of Onset   Hypertension Mother    Cancer Mother        cervical   Hyperlipidemia Mother    Hypertension Father    Hyperlipidemia Father    Diabetes Other    Cancer Other 68       breast   Hyperlipidemia Brother    Hypertension Brother    Heart failure Maternal Aunt    Hyperlipidemia Maternal Grandmother    Hypertension Maternal Grandmother    Diabetes Maternal Grandmother    Hyperlipidemia Maternal Grandfather    Hypertension Maternal Grandfather    Hyperlipidemia Paternal Grandmother    Hypertension Paternal Grandmother    Hyperlipidemia Paternal Grandfather    Hypertension Paternal Grandfather     Review of Systems  Exam:   LMP 08/09/2006     General appearance: alert, cooperative and appears stated age Head: normocephalic, without obvious abnormality, atraumatic Neck: no adenopathy, supple, symmetrical, trachea midline and thyroid normal to inspection and palpation Lungs: clear to auscultation bilaterally Breasts: normal appearance, no masses or tenderness, No nipple retraction or dimpling, No nipple discharge or bleeding, No axillary adenopathy Heart: regular rate and rhythm Abdomen: soft,  non-tender; no masses, no organomegaly Extremities: extremities normal, atraumatic, no cyanosis or edema Skin: skin color, texture, turgor normal. No rashes or lesions Lymph nodes: cervical, supraclavicular, and axillary nodes normal. Neurologic: grossly normal  Pelvic: External genitalia:  no lesions              No abnormal inguinal nodes palpated.              Urethra:  normal appearing urethra with no masses, tenderness or lesions              Bartholins and Skenes: normal                 Vagina: normal appearing vagina with normal color and discharge, no lesions              Cervix: no lesions              Pap taken: {yes no:314532} Bimanual Exam:  Uterus:  normal size, contour, position, consistency,  mobility, non-tender              Adnexa: no mass, fullness, tenderness              Rectal exam: {yes no:314532}.  Confirms.              Anus:  normal sphincter tone, no lesions  Chaperone was present for exam:  ***  Assessment:   Well woman visit with gynecologic exam.   Plan: Mammogram screening discussed. Self breast awareness reviewed. Pap and HR HPV as above. Guidelines for Calcium, Vitamin D, regular exercise program including cardiovascular and weight bearing exercise.   Follow up annually and prn.   Additional counseling given.  {yes [id number]. _______ minutes face to face time of which over 50% was spent in counseling.    After visit summary provided.

## 2022-10-11 ENCOUNTER — Ambulatory Visit: Payer: No Typology Code available for payment source | Admitting: Obstetrics and Gynecology

## 2022-11-09 NOTE — Progress Notes (Signed)
62 y.o. G84P2002 Married [place] American female here for annual exam.    Patient is on a transplant list for a kidney.  Not doing dialysis at this time.  PCP:   Dr. Duanne Guess  Patient's last menstrual period was 08/09/2006.           Sexually active: Yes.    The current method of family planning is post menopausal status/BTL.    Exercising: Yes.     Youtube workouts Smoker:  former  Health Maintenance: Pap:  10/09/21 neg: HR HPV neg, 06/08/19 neg: HR HPV neg History of abnormal Pap:  yes, no CIN II or greater, many years ago  MMG: Just had one 11/2021? at [facility] per pt,  11/22/17 Breast Density Category A, BI-RADS CAT 2 benign Colonoscopy:  08/09/10, done with Dr. Loreta Ave 2022, removed polyp.  Due at age 89 yo. BMD:   09/09/16  Result  osteopenia right hip.  TDaP:  08/09/16 Gardasil:   no HIV: 04/23/16 NR Hep C: 04/23/16 neg Screening Labs:  PCP   reports that she has quit smoking. She has never used smokeless tobacco. She reports that she does not drink alcohol and does not use drugs.  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     carvedilol (COREG) 25 MG tablet Take 25 mg by mouth 2 (two) times daily.     chlorthalidone (HYGROTON) 25 MG tablet 12.5 mg daily.     Cholecalciferol 125 MCG (5000 UT) capsule Take by mouth.     Melatonin 2.5 MG CHEW Chew by mouth.     predniSONE (DELTASONE) 20 MG  tablet Take 2 tablets (40 mg total) by mouth daily. 10 tablet 0   rosuvastatin (CRESTOR) 40 MG tablet      No current facility-administered medications for this visit.    Family History  Problem Relation Age of Onset   Hypertension Mother    Cancer Mother        cervical   Hyperlipidemia Mother    Hypertension Father    Hyperlipidemia Father    Diabetes Other    Cancer Other 2       breast   Hyperlipidemia Brother    Hypertension Brother    Heart failure Maternal Aunt    Hyperlipidemia Maternal Grandmother    Hypertension Maternal Grandmother    Diabetes Maternal Grandmother    Hyperlipidemia Maternal Grandfather    Hypertension Maternal Grandfather    Hyperlipidemia Paternal Grandmother    Hypertension Paternal Grandmother    Hyperlipidemia Paternal Grandfather    Hypertension Paternal Grandfather     Review of Systems  All other systems reviewed and are negative.   Exam:   BP 120/72 (BP Location: Right Arm, Patient Position: Sitting, Cuff Size:  Large)   Pulse 62   Ht 5\' 3"  (1.6 m)   Wt 186 lb (84.4 kg)   LMP 08/09/2006   SpO2 99%   BMI 32.95 kg/m     General appearance: alert, cooperative and appears stated age Head: normocephalic, without obvious abnormality, atraumatic Neck: no adenopathy, supple, symmetrical, trachea midline and thyroid normal to inspection and palpation Lungs: clear to auscultation bilaterally Breasts: normal appearance, no masses or tenderness, No nipple retraction or dimpling, No nipple discharge or bleeding, No axillary adenopathy Heart: regular rate and rhythm Abdomen: soft, non-tender; no masses, no organomegaly Extremities: extremities normal, atraumatic, no cyanosis or edema Skin: skin color, texture, turgor normal. No rashes or lesions Lymph nodes: cervical, supraclavicular, and axillary nodes normal. Neurologic: grossly normal  Pelvic: External genitalia:  no lesions              No abnormal inguinal nodes palpated.               Urethra:  normal appearing urethra with no masses, tenderness or lesions              Bartholins and Skenes: normal                 Vagina: normal appearing vagina with normal color and discharge, no lesions              Cervix: no lesions              Pap taken: yes Bimanual Exam:  Uterus:  normal size, contour, position, consistency, mobility, non-tender              Adnexa: no mass, fullness, tenderness              Rectal exam: yes.  Confirms.              Anus:  normal sphincter tone, no lesions  Chaperone was present for exam:  Warren Lacy, CMA  Assessment:   Well woman visit with gynecologic exam. 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  End stage renal disease.  On kidney transplant list.  Hx HSV I and II. Vaginal atrophy. Osteopenia.  Plan: Mammogram screening discussed. We will get her updated mammogram from [facility].  Self breast awareness reviewed. Pap and HR HPV collected. Guidelines for Calcium, Vitamin D, regular exercise program including cardiovascular and weight bearing exercise. She will try vit E for the vagina. Patient may contact me back about possible vaginal estrogen treatment after mammogram is back and normal.  I did discuss potential effect on breast cancer.  Follow up annually and prn.   After visit summary provided.

## 2022-11-23 ENCOUNTER — Other Ambulatory Visit (HOSPITAL_COMMUNITY)
Admission: RE | Admit: 2022-11-23 | Discharge: 2022-11-23 | Disposition: A | Discharge status: Home / Self Care | Payer: No Typology Code available for payment source | Source: Ambulatory Visit | Attending: Obstetrics and Gynecology | Admitting: Obstetrics and Gynecology

## 2022-11-23 ENCOUNTER — Ambulatory Visit (INDEPENDENT_AMBULATORY_CARE_PROVIDER_SITE_OTHER): Payer: No Typology Code available for payment source | Admitting: Obstetrics and Gynecology

## 2022-11-23 ENCOUNTER — Encounter: Payer: Self-pay | Admitting: Obstetrics and Gynecology

## 2022-11-23 VITALS — BP 120/72 | HR 62 | Ht 63.0 in | Wt 186.0 lb

## 2022-11-23 DIAGNOSIS — Z01419 Encounter for gynecological examination (general) (routine) without abnormal findings: Secondary | ICD-10-CM | POA: Diagnosis not present

## 2022-11-23 DIAGNOSIS — Z124 Encounter for screening for malignant neoplasm of cervix: Secondary | ICD-10-CM

## 2022-11-23 DIAGNOSIS — N952 Postmenopausal atrophic vaginitis: Secondary | ICD-10-CM | POA: Diagnosis not present

## 2022-11-23 NOTE — Patient Instructions (Addendum)
EXERCISE AND DIET:  We recommended that you start or continue a regular exercise program for good health. Regular exercise means any activity that makes your heart beat faster and makes you sweat.  We recommend exercising at least 30 minutes per day at least 3 days a week, preferably 4 or 5.  We also recommend a diet low in fat and sugar.  Inactivity, poor dietary choices and obesity can cause diabetes, heart attack, stroke, and kidney damage, among others.    ALCOHOL AND SMOKING:  Women should limit their alcohol intake to no more than 7 drinks/beers/glasses of wine (combined, not each!) per week. Moderation of alcohol intake to this level decreases your risk of breast cancer and liver damage. And of course, no recreational drugs are part of a healthy lifestyle.  And absolutely no smoking or even second hand smoke. Most people know smoking can cause heart and lung diseases, but did you know it also contributes to weakening of your bones? Aging of your skin?  Yellowing of your teeth and nails?  CALCIUM AND VITAMIN D:  Adequate intake of calcium and Vitamin D are recommended.  The recommendations for exact amounts of these supplements seem to change often, but generally speaking 600 mg of calcium (either carbonate or citrate) and 800 units of Vitamin D per day seems prudent. Certain women may benefit from higher intake of Vitamin D.  If you are among these women, your doctor will have told you during your visit.    PAP SMEARS:  Pap smears, to check for cervical cancer or precancers,  have traditionally been done yearly, although recent scientific advances have shown that most women can have pap smears less often.  However, every woman still should have a physical exam from her gynecologist every year. It will include a breast check, inspection of the vulva and vagina to check for abnormal growths or skin changes, a visual exam of the cervix, and then an exam to evaluate the size and shape of the uterus and  ovaries.  And after 63 years of age, a rectal exam is indicated to check for rectal cancers. We will also provide age appropriate advice regarding health maintenance, like when you should have certain vaccines, screening for sexually transmitted diseases, bone density testing, colonoscopy, mammograms, etc.   MAMMOGRAMS:  All women over 40 years old should have a yearly mammogram. Many facilities now offer a "3D" mammogram, which may cost around $50 extra out of pocket. If possible,  we recommend you accept the option to have the 3D mammogram performed.  It both reduces the number of women who will be called back for extra views which then turn out to be normal, and it is better than the routine mammogram at detecting truly abnormal areas.    COLONOSCOPY:  Colonoscopy to screen for colon cancer is recommended for all women at age 50.  We know, you hate the idea of the prep.  We agree, BUT, having colon cancer and not knowing it is worse!!  Colon cancer so often starts as a polyp that can be seen and removed at colonscopy, which can quite literally save your life!  And if your first colonoscopy is normal and you have no family history of colon cancer, most women don't have to have it again for 10 years.  Once every ten years, you can do something that may end up saving your life, right?  We will be happy to help you get it scheduled when you are ready.    Be sure to check your insurance coverage so you understand how much it will cost.  It may be covered as a preventative service at no cost, but you should check your particular policy.    Atrophic Vaginitis  Atrophic vaginitis is a condition in which the tissues that line the vagina become dry and thin. This condition is most common in women who have stopped having regular menstrual periods (are in menopause). This usually starts when a woman is 45 to 63 years old. That is the time when a woman's estrogen levels begin to decrease. Estrogen is a female hormone.  It helps to keep the tissues of the vagina moist. It stimulates the vagina to produce a clear fluid that lubricates the vagina for sex. This fluid also protects the vagina from infection. Lack of estrogen can cause the lining of the vagina to get thinner and dryer. The vagina may also shrink in size. It may become less elastic. Atrophic vaginitis tends to get worse over time as a woman's estrogen level drops. What are the causes? This condition is caused by the normal drop in estrogen that happens around the time of menopause. What increases the risk? Certain conditions or situations may lower a woman's estrogen level, leading to a higher risk for atrophic vaginitis. You are more likely to develop this condition if: You are taking medicines that block estrogen. You have had your ovaries removed. You are being treated for cancer with radiation or medicines (chemotherapy). You have given birth or are breastfeeding. You are older than age 50. You smoke. What are the signs or symptoms? Symptoms of this condition include: Pain, soreness, a feeling of pressure, or bleeding during sex (dyspareunia). Vaginal burning, irritation, or itching. Pain or bleeding when a speculum is used in a vaginal exam. Having burning pain while urinating. Vaginal discharge. In some cases, there are no symptoms. How is this diagnosed? This condition is diagnosed based on your medical history and a physical exam. This will include a pelvic exam that checks the vaginal tissues. Though rare, you may also have other tests, including: A urine test. A test that checks the acid balance in your vagina (acid balance test). How is this treated? Treatment for this condition depends on how severe your symptoms are. Treatment may include: Using an over-the-counter vaginal lubricant before sex. Using a long-acting vaginal moisturizer. Using low-dose estrogen for moderate to severe symptoms that do not respond to other treatments.  Options include creams, tablets, and inserts (vaginal rings). Before you use a vaginal estrogen, tell your health care provider if you have a history of: Breast cancer. Endometrial cancer. Blood clots. If you are not sexually active and your symptoms are very mild, you may not need treatment. Follow these instructions at home: Medicines Take over-the-counter and prescription medicines only as told by your health care provider. Do not use herbal or alternative medicines unless your health care provider says that you can. Use over-the-counter creams, lubricants, or moisturizers for dryness only as told by your health care provider. General instructions If your atrophic vaginitis is caused by menopause, discuss all of your menopause symptoms and treatment options with your health care provider. Do not douche. Do not use products that can make your vagina dry. These include: Scented feminine sprays. Scented tampons. Scented soaps. Vaginal sex can help to improve blood flow and elasticity of vaginal tissue. If you choose to have sex and it hurts, try using a water-soluble lubricant or moisturizer right before having sex. Contact   a health care provider if: Your discharge looks different than normal. Your vagina has an unusual smell. You have new symptoms. Your symptoms do not improve with treatment. Your symptoms get worse. Summary Atrophic vaginitis is a condition in which the tissues that line the vagina become dry and thin. It is most common in women who have stopped having regular menstrual periods (are in menopause). Treatment options include using vaginal lubricants and low-dose vaginal estrogen. Contact a health care provider if your vagina has an unusual smell, or if your symptoms get worse or do not improve after treatment. This information is not intended to replace advice given to you by your health care provider. Make sure you discuss any questions you have with your health care  provider. Document Revised: 01/24/2020 Document Reviewed: 01/24/2020 Elsevier Patient Education  2023 Elsevier Inc.  

## 2022-11-25 LAB — CYTOLOGY - PAP
Adequacy: ABSENT
Comment: NEGATIVE
Diagnosis: NEGATIVE
High risk HPV: NEGATIVE

## 2023-02-03 ENCOUNTER — Other Ambulatory Visit: Payer: Self-pay

## 2023-02-03 ENCOUNTER — Other Ambulatory Visit: Payer: Self-pay | Admitting: Family Medicine

## 2023-02-03 ENCOUNTER — Ambulatory Visit
Admission: RE | Admit: 2023-02-03 | Discharge: 2023-02-03 | Disposition: A | Discharge status: Home / Self Care | Payer: No Typology Code available for payment source | Source: Ambulatory Visit | Attending: Family Medicine | Admitting: Family Medicine

## 2023-02-03 DIAGNOSIS — M170 Bilateral primary osteoarthritis of knee: Secondary | ICD-10-CM

## 2023-03-23 HISTORY — PX: KIDNEY TRANSPLANT: SHX239

## 2024-06-12 ENCOUNTER — Encounter: Payer: Self-pay | Admitting: Obstetrics and Gynecology

## 2024-06-12 ENCOUNTER — Other Ambulatory Visit (HOSPITAL_COMMUNITY)
Admission: RE | Admit: 2024-06-12 | Discharge: 2024-06-12 | Disposition: A | Discharge status: Home / Self Care | Source: Ambulatory Visit | Attending: Obstetrics and Gynecology | Admitting: Obstetrics and Gynecology

## 2024-06-12 ENCOUNTER — Ambulatory Visit: Admitting: Obstetrics and Gynecology

## 2024-06-12 VITALS — BP 124/74 | Ht 63.5 in | Wt 178.0 lb

## 2024-06-12 DIAGNOSIS — Z124 Encounter for screening for malignant neoplasm of cervix: Secondary | ICD-10-CM | POA: Insufficient documentation

## 2024-06-12 DIAGNOSIS — M8580 Other specified disorders of bone density and structure, unspecified site: Secondary | ICD-10-CM | POA: Diagnosis not present

## 2024-06-12 DIAGNOSIS — Z01419 Encounter for gynecological examination (general) (routine) without abnormal findings: Secondary | ICD-10-CM

## 2024-06-12 DIAGNOSIS — Z780 Asymptomatic menopausal state: Secondary | ICD-10-CM | POA: Diagnosis not present

## 2024-06-12 DIAGNOSIS — Z1331 Encounter for screening for depression: Secondary | ICD-10-CM | POA: Diagnosis not present

## 2024-06-12 NOTE — Patient Instructions (Signed)

## 2024-06-12 NOTE — Progress Notes (Signed)
 63 y.o. G77P2002 Married African American female here for annual exam.   New PCP - [facility] - [facility]   Had kidney transplant in August, 2024 at [facility].   Dealing with rejection issues.    Back at work.   PCP: Waylan Almarie SAUNDERS, MD   Patient's last menstrual period was 08/09/2006.           Sexually active: Yes.    The current method of family planning is tubal ligation.    Menopausal hormone therapy:  n/a Exercising: Yes.    At home workouts and walking  Smoker:  Former   OB History  Gravida Para Term Preterm AB Living  2 2 2  0 0 2  SAB IAB Ectopic Multiple Live Births  0 0 0 0 2    # Outcome Date GA Lbr Len/2nd Weight Sex Type Anes PTL Lv  2 Term 68    F Vag-Spont   LIV  1 Term 62    F Vag-Spont   LIV     HEALTH MAINTENANCE: Last 2 paps:  11/23/22 neg HR HPV neg, 10/09/21 neg HR HPV neg History of abnormal Pap or positive HPV:   yes, CIN II or greater, many years ago Mammogram:  December 2024 per pt. 11/22/17 Breast Density Cat A, BIRADS Cat 2 benign  Colonoscopy:  08/09/10 done with Dr. Kristie 2022, removed polyp.  Due at age 60 yo.  Bone Density:  09/09/16  Result  osteopenia  right hip   Immunization History  Administered Date(s) Administered   Influenza-Unspecified 06/20/2012, 05/11/2013   Moderna Covid-19 Vaccine Bivalent Booster 12yrs & up 07/01/2021   Moderna Sars-Covid-2 Vaccination 10/26/2019, 11/26/2019   PFIZER Comirnaty(Gray Top)Covid-19 Tri-Sucrose Vaccine 05/31/2020, 03/28/2021   PFIZER(Purple Top)SARS-COV-2 Vaccination 05/31/2020   PPD Test 06/21/2013   Pfizer(Comirnaty)Fall Seasonal Vaccine 12 years and older 05/29/2022   Tdap 08/09/2016      reports that she has quit smoking. She has never used smokeless tobacco. She reports that she does not drink alcohol and does not use drugs.  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     Azelastine HCl 137 MCG/SPRAY SOLN Place into both nostrils.     carvedilol (COREG) 25 MG tablet Take 25 mg by mouth 2 (two) times daily.     Cholecalciferol 125 MCG (5000 UT) capsule Take by mouth.     folic acid (FOLVITE) 1 MG tablet Take 1 mg by mouth daily.     Melatonin 2.5 MG CHEW Chew by mouth.     predniSONE  (DELTASONE ) 5 MG tablet Take 5 mg by mouth daily.     rosuvastatin (CRESTOR) 10 MG tablet Take 10 mg by mouth  at bedtime.     valGANciclovir (VALCYTE) 450 MG tablet Take 450 mg by mouth daily.     No current facility-administered medications for this visit.    ALLERGIES: Bactrim  [sulfamethoxazole -trimethoprim ], Ceftin  [cefuroxime ], Crestor [rosuvastatin], Lipitor [atorvastatin], and Vytorin [ezetimibe-simvastatin]  Family History  Problem Relation Age of Onset   Hypertension Mother    Cancer Mother        cervical   Hyperlipidemia Mother    Hypertension Father    Hyperlipidemia Father    Diabetes Other    Cancer Other 40       breast   Hyperlipidemia Brother    Hypertension Brother    Heart failure Maternal Aunt    Hyperlipidemia Maternal Grandmother    Hypertension Maternal Grandmother    Diabetes Maternal Grandmother    Hyperlipidemia Maternal Grandfather    Hypertension Maternal Grandfather    Hyperlipidemia Paternal Grandmother    Hypertension Paternal Grandmother    Hyperlipidemia Paternal Grandfather    Hypertension  Paternal Grandfather     Review of Systems  All other systems reviewed and are negative.   PHYSICAL EXAM:  BP 124/74 (BP Location: Left Arm, Patient Position: Sitting)   Ht 5' 3.5 (1.613 m)   Wt 178 lb (80.7 kg)   LMP 08/09/2006   BMI 31.04 kg/m     General appearance: alert, cooperative and appears stated age Head: normocephalic, without obvious abnormality, atraumatic Neck: no adenopathy, supple, symmetrical, trachea midline and thyroid normal to inspection and palpation Lungs: clear to auscultation bilaterally Breasts: consistent with reduction, no masses or tenderness, No nipple retraction or dimpling, No nipple discharge or bleeding, No axillary adenopathy Heart: regular rate and rhythm Abdomen: incisions noted.  Abdomen is soft, non-tender; kidney palpable in right lower quadrant - nontender, no organomegaly. Extremities: extremities normal, atraumatic, no cyanosis or edema Skin: skin color, texture, turgor normal. No rashes or lesions Lymph nodes: cervical, supraclavicular, and axillary nodes normal. Neurologic: grossly normal  Pelvic: External genitalia:  no lesions              No abnormal inguinal nodes palpated.              Urethra:  normal appearing urethra with no masses, tenderness or lesions              Bartholins and Skenes: normal                 Vagina: normal appearing vagina with normal color and discharge, no lesions              Cervix: no lesions              Pap taken: yes Bimanual Exam:  Uterus:  normal size, contour, position, consistency, mobility, non-tender              Adnexa: no mass, fullness, tenderness              Rectal exam: yes.  Confirms.              Anus:  normal sphincter tone, no lesions  Chaperone was present for exam:  Kari HERO, CMA  ASSESSMENT: Well woman visit with gynecologic exam. Status post bilateral breast reduction.  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  Hx renal transplant. Hx HSV I and II. PHQ-2-9: 0 Menopausal  female. Osteopenia.  Taking steroids.  PLAN: Mammogram screening discussed.  Will get a copy of mammogram from [facility] 2024.  Self breast awareness reviewed. Pap and HRV  collected:  yes Guidelines for Calcium , Vitamin D , regular exercise program including cardiovascular and weight bearing exercise. Medication refills:  NA Dexa at [facility].   Will fax order.  Follow up:  yearly and prn.

## 2024-06-13 ENCOUNTER — Ambulatory Visit: Payer: Self-pay | Admitting: Obstetrics and Gynecology

## 2024-06-13 LAB — CYTOLOGY - PAP
Adequacy: ABSENT
Comment: NEGATIVE
Diagnosis: NEGATIVE
High risk HPV: NEGATIVE

## 2025-06-18 ENCOUNTER — Ambulatory Visit: Admitting: Obstetrics and Gynecology
